# Patient Record
Sex: Female | Born: 1935 | Race: Black or African American | Hispanic: No | Marital: Single | Smoking: Never smoker
Health system: Southern US, Community
[De-identification: ages and names within clinical notes are randomized; demographics above are authoritative.]

## PROBLEM LIST (undated history)

## (undated) DIAGNOSIS — Z8679 Personal history of other diseases of the circulatory system: Secondary | ICD-10-CM

## (undated) DIAGNOSIS — I639 Cerebral infarction, unspecified: Secondary | ICD-10-CM

## (undated) DIAGNOSIS — I10 Essential (primary) hypertension: Secondary | ICD-10-CM

## (undated) DIAGNOSIS — I350 Nonrheumatic aortic (valve) stenosis: Secondary | ICD-10-CM

## (undated) DIAGNOSIS — I2510 Atherosclerotic heart disease of native coronary artery without angina pectoris: Secondary | ICD-10-CM

## (undated) DIAGNOSIS — Z9289 Personal history of other medical treatment: Secondary | ICD-10-CM

## (undated) DIAGNOSIS — E785 Hyperlipidemia, unspecified: Secondary | ICD-10-CM

## (undated) DIAGNOSIS — I779 Disorder of arteries and arterioles, unspecified: Secondary | ICD-10-CM

## (undated) DIAGNOSIS — I739 Peripheral vascular disease, unspecified: Secondary | ICD-10-CM

## (undated) DIAGNOSIS — E119 Type 2 diabetes mellitus without complications: Secondary | ICD-10-CM

## (undated) HISTORY — DX: Type 2 diabetes mellitus without complications: E11.9

## (undated) HISTORY — PX: WRIST FRACTURE SURGERY: SHX121

## (undated) HISTORY — PX: LEG SURGERY: SHX1003

## (undated) HISTORY — DX: Essential (primary) hypertension: I10

## (undated) HISTORY — DX: Personal history of other medical treatment: Z92.89

---

## 2012-09-07 ENCOUNTER — Encounter (HOSPITAL_BASED_OUTPATIENT_CLINIC_OR_DEPARTMENT_OTHER): Payer: Self-pay

## 2012-09-07 ENCOUNTER — Emergency Department (HOSPITAL_BASED_OUTPATIENT_CLINIC_OR_DEPARTMENT_OTHER)
Admission: EM | Admit: 2012-09-07 | Discharge: 2012-09-08 | Disposition: A | Discharge status: Home / Self Care | Payer: Medicare Other | Attending: Emergency Medicine | Admitting: Emergency Medicine

## 2012-09-07 DIAGNOSIS — I10 Essential (primary) hypertension: Secondary | ICD-10-CM | POA: Insufficient documentation

## 2012-09-07 DIAGNOSIS — Z23 Encounter for immunization: Secondary | ICD-10-CM | POA: Insufficient documentation

## 2012-09-07 DIAGNOSIS — Y92009 Unspecified place in unspecified non-institutional (private) residence as the place of occurrence of the external cause: Secondary | ICD-10-CM | POA: Insufficient documentation

## 2012-09-07 DIAGNOSIS — Y9389 Activity, other specified: Secondary | ICD-10-CM | POA: Insufficient documentation

## 2012-09-07 DIAGNOSIS — W230XXA Caught, crushed, jammed, or pinched between moving objects, initial encounter: Secondary | ICD-10-CM | POA: Insufficient documentation

## 2012-09-07 DIAGNOSIS — S61411A Laceration without foreign body of right hand, initial encounter: Secondary | ICD-10-CM

## 2012-09-07 DIAGNOSIS — E119 Type 2 diabetes mellitus without complications: Secondary | ICD-10-CM | POA: Insufficient documentation

## 2012-09-07 DIAGNOSIS — S61409A Unspecified open wound of unspecified hand, initial encounter: Secondary | ICD-10-CM | POA: Insufficient documentation

## 2012-09-07 NOTE — ED Provider Notes (Signed)
History     CSN: [id number]  Arrival date & time 09/07/12  2104   First MD Initiated Contact with Patient 09/07/12 2333      Chief Complaint  Patient presents with  . Hand Injury    (Consider location/radiation/quality/duration/timing/severity/associated sxs/prior treatment) HPI This is an 78 year old female who got her right hand caught in a door at home. She is not sure exactly what happened but she noticed afterwards that there was bleeding from her right thenar web. She noticed a laceration there. There was no associated pain. There was significant bleeding that was controlled by pressure. She is not sure of her last tetanus shot. She denies other injury. There is no sensory or motor deficit.  Past Medical History  Diagnosis Date  . Hypertension   . Diabetes mellitus without complication     Past Surgical History  Procedure Date  . Wrist fracture surgery   . Leg surgery     No family history on file.  History  Substance Use Topics  . Smoking status: Never Smoker   . Smokeless tobacco: Not on file  . Alcohol Use: No    OB History    Grav Para Term Preterm Abortions TAB SAB Ect Mult Living                  Review of Systems  All other systems reviewed and are negative.    Allergies  Aspirin and Codeine  Home Medications   Current Outpatient Rx  Name  Route  Sig  Dispense  Refill  . UNKNOWN TO PATIENT      DM pills-no insulin HTN meds           BP 150/52  Pulse 72  Temp 97.8 F (36.6 C) (Oral)  Resp 18  Ht 5' 3.5" (1.613 m)  Wt 140 lb (63.504 kg)  BMI 24.41 kg/m2  SpO2 98%  Physical Exam General: Well-developed, well-nourished female in no acute distress; appears younger than age of record HENT: normocephalic, atraumatic Eyes: Normal appearance Neck: supple Heart: regular rate and rhythm Lungs: Normal respiratory effort and excursion Abdomen: soft; nondistended Extremities: Arthritic changes; no acute deformity; flap laceration of  right thenar web, right hand distally neurovascularly intact with intact tendon function Neurologic: Awake, alert; motor function intact in all extremities and symmetric; no facial droop Skin: Warm and dry Psychiatric: Normal mood and affect    ED Course  Procedures (including critical care time)  LACERATION REPAIR Performed by: Mercede Rollo L Authorized by: Hanley Seamen Consent: Verbal consent obtained. Risks and benefits: risks, benefits and alternatives were discussed Consent given by: patient Patient identity confirmed: provided demographic data Prepped and Draped in normal sterile fashion Wound explored  Laceration Location: Right thenar web, complex  Laceration Length: 3 cm  No Foreign Bodies seen or palpated  Anesthesia: local infiltration  Local anesthetic: lidocaine 2 % with epinephrine  Anesthetic total: 2 ml  Irrigation method: syringe Amount of cleaning: standard  Skin closure: 5-0 Prolene   Number of sutures: 6   Technique: Simple interrupted   Patient tolerance: Patient tolerated the procedure well with no immediate complications.    MDM          Hanley Seamen, MD 09/08/12 [phone]

## 2012-09-07 NOTE — ED Notes (Signed)
MD at bedside. 

## 2012-09-07 NOTE — ED Notes (Signed)
Pt states she was attempting to close door-cut right hand-pt unsure of how happened/noticed blood-jagged lac noted to right web-cleaned in triage and gauze dsg applied

## 2012-09-08 MED ORDER — CEPHALEXIN 250 MG PO CAPS: 250.0000 mg | ORAL_CAPSULE | Freq: Four times a day (QID) | ORAL | Status: DC

## 2012-09-08 MED ORDER — CEPHALEXIN 250 MG PO CAPS
500.0000 mg | ORAL_CAPSULE | Freq: Once | ORAL | Status: AC
  Administered 2012-09-08: 500 mg via ORAL
  Filled 2012-09-08: qty 2

## 2012-09-08 MED ORDER — TETANUS-DIPHTH-ACELL PERTUSSIS 5-2.5-18.5 LF-MCG/0.5 IM SUSP
0.5000 mL | Freq: Once | INTRAMUSCULAR | Status: AC
  Administered 2012-09-08: 0.5 mL via INTRAMUSCULAR
  Filled 2012-09-08: qty 0.5

## 2012-09-22 ENCOUNTER — Emergency Department (HOSPITAL_BASED_OUTPATIENT_CLINIC_OR_DEPARTMENT_OTHER)
Admission: EM | Admit: 2012-09-22 | Discharge: 2012-09-22 | Disposition: A | Discharge status: Home / Self Care | Payer: Medicare Other | Attending: Emergency Medicine | Admitting: Emergency Medicine

## 2012-09-22 ENCOUNTER — Encounter (HOSPITAL_BASED_OUTPATIENT_CLINIC_OR_DEPARTMENT_OTHER): Payer: Self-pay | Admitting: *Deleted

## 2012-09-22 DIAGNOSIS — E119 Type 2 diabetes mellitus without complications: Secondary | ICD-10-CM | POA: Insufficient documentation

## 2012-09-22 DIAGNOSIS — I10 Essential (primary) hypertension: Secondary | ICD-10-CM | POA: Insufficient documentation

## 2012-09-22 DIAGNOSIS — Z4802 Encounter for removal of sutures: Secondary | ICD-10-CM | POA: Insufficient documentation

## 2012-09-22 DIAGNOSIS — Z79899 Other long term (current) drug therapy: Secondary | ICD-10-CM | POA: Insufficient documentation

## 2012-09-22 NOTE — ED Notes (Signed)
Right hand removal of stitches.

## 2012-09-22 NOTE — ED Provider Notes (Signed)
History     CSN: [id number]  Arrival date & time 09/22/12  1123   First MD Initiated Contact with Patient 09/22/12 1241      Chief Complaint  Patient presents with  . Suture / Staple Removal    (Consider location/radiation/quality/duration/timing/severity/associated sxs/prior treatment) Patient is a 78 y.o. female presenting with suture removal. The history is provided by the patient. No language interpreter was used.  Suture / Staple Removal  Treatments since wound repair include antibiotic ointment use. There has been no drainage from the wound. She has no difficulty moving the affected extremity or digit.   suture removal to the right thenar web x6. No erythema, drainage or pain.  Past Medical History  Diagnosis Date  . Hypertension   . Diabetes mellitus without complication     Past Surgical History  Procedure Date  . Wrist fracture surgery   . Leg surgery     No family history on file.  History  Substance Use Topics  . Smoking status: Never Smoker   . Smokeless tobacco: Not on file  . Alcohol Use: No    OB History    Grav Para Term Preterm Abortions TAB SAB Ect Mult Living                  Review of Systems  Constitutional: Negative.   HENT: Negative.   Eyes: Negative.   Respiratory: Negative.   Cardiovascular: Negative.   Gastrointestinal: Negative.   Skin:       Sutures to R hand  Neurological: Negative.   Psychiatric/Behavioral: Negative.   All other systems reviewed and are negative.    Allergies  Aspirin and Codeine  Home Medications   Current Outpatient Rx  Name  Route  Sig  Dispense  Refill  . CEPHALEXIN 250 MG PO CAPS   Oral   Take 1 capsule (250 mg total) by mouth 4 (four) times daily.   20 capsule   0   . UNKNOWN TO PATIENT      DM pills-no insulin HTN meds           BP 160/61  Pulse 66  Temp 98 F (36.7 C) (Oral)  Resp 18  SpO2 98%  Physical Exam  Nursing note and vitals reviewed. Constitutional: She is  oriented to person, place, and time. She appears well-developed and well-nourished.  HENT:  Head: Normocephalic and atraumatic.  Eyes: Conjunctivae normal and EOM are normal. Pupils are equal, round, and reactive to light.  Neck: Normal range of motion. Neck supple.  Cardiovascular: Normal rate, regular rhythm, normal heart sounds and intact distal pulses.  Exam reveals no gallop and no friction rub.   No murmur heard. Pulmonary/Chest: Effort normal and breath sounds normal.  Abdominal: Soft. Bowel sounds are normal.  Musculoskeletal: Normal range of motion. She exhibits no edema and no tenderness.  Neurological: She is alert and oriented to person, place, and time. She has normal reflexes.  Skin: Skin is warm and dry.       Sutures in tact to R hand  Psychiatric: She has a normal mood and affect.    ED Course  Procedures (including critical care time)  Labs Reviewed - No data to display No results found.   No diagnosis found.    MDM  Sutures removed to R hand         Remi Haggard, NP 09/23/12 1044

## 2012-09-30 NOTE — ED Provider Notes (Signed)
Medical screening examination/treatment/procedure(s) were performed by non-physician practitioner and as supervising physician I was immediately available for consultation/collaboration.   Charles B. Sheldon, MD 09/30/12 1024 

## 2013-11-11 ENCOUNTER — Non-Acute Institutional Stay (SKILLED_NURSING_FACILITY): Payer: Medicare Other | Admitting: Internal Medicine

## 2013-11-11 ENCOUNTER — Encounter: Payer: Self-pay | Admitting: Internal Medicine

## 2013-11-11 ENCOUNTER — Other Ambulatory Visit: Payer: Self-pay | Admitting: *Deleted

## 2013-11-11 DIAGNOSIS — S32599A Other specified fracture of unspecified pubis, initial encounter for closed fracture: Secondary | ICD-10-CM

## 2013-11-11 DIAGNOSIS — E119 Type 2 diabetes mellitus without complications: Secondary | ICD-10-CM

## 2013-11-11 DIAGNOSIS — I4891 Unspecified atrial fibrillation: Secondary | ICD-10-CM

## 2013-11-11 DIAGNOSIS — R42 Dizziness and giddiness: Secondary | ICD-10-CM | POA: Insufficient documentation

## 2013-11-11 DIAGNOSIS — S32509A Unspecified fracture of unspecified pubis, initial encounter for closed fracture: Secondary | ICD-10-CM

## 2013-11-11 MED ORDER — TRAMADOL HCL 50 MG PO TABS: ORAL_TABLET | ORAL | Status: DC

## 2013-11-11 NOTE — Telephone Encounter (Signed)
[facility] 

## 2013-11-11 NOTE — Progress Notes (Signed)
HISTORY & PHYSICAL  DATE: 11/11/2013   FACILITY: [facility]  LEVEL OF CARE: SNF (31)  ALLERGIES:  Allergies  Allergen Reactions  . Aspirin Nausea And Vomiting  . Codeine Nausea And Vomiting    CHIEF COMPLAINT:  Manage left inferior and superior pubic rami fractures, atrial fibrillation and diabetes mellitus  HISTORY OF PRESENT ILLNESS: Patient is an 80 year old African American female who was hospitalized after a fall.  PELVIC FRACTURE: The patient had a fall and sustained left inferior and superior pubic rami fractures. Conservative management was recommended by orthopedic surgery. Patient is admitted to this facility for short-term rehabilitation. The patient denies ongoing pain. No complications reported from the pain medication(s) currently being used.  ATRIAL FIBRILLATION: the patients atrial fibrillation remains stable.  The patient denies DOE, tachycardia, orthopnea, transient neurological sx, pedal edema, palpitations, & PNDs.  No complications noted from the medications currently being used.  DM:pt's DM remains stable.  Pt denies polyuria, polydipsia, polyphagia, changes in vision or hypoglycemic episodes.  No complications noted from the medication presently being used.  Last hemoglobin A1c is: Not available.  PAST MEDICAL HISTORY :  Past Medical History  Diagnosis Date  . Hypertension   . Diabetes mellitus without complication     PAST SURGICAL HISTORY: Past Surgical History  Procedure Laterality Date  . Wrist fracture surgery    . Leg surgery      SOCIAL HISTORY:  reports that she has never smoked. She does not have any smokeless tobacco history on file. She reports that she does not drink alcohol.  FAMILY HISTORY: None   CURRENT MEDICATIONS: Reviewed per MAR  REVIEW OF SYSTEMS:  See HPI otherwise 14 point ROS is negative.  PHYSICAL EXAMINATION  VS:  See VS section  GENERAL: no acute distress, normal body  habitus EYES: conjunctivae normal, sclerae normal, normal eye lids MOUTH/THROAT: lips without lesions,no lesions in the mouth,tongue is without lesions,uvula elevates in midline NECK: supple, trachea midline, no neck masses, no thyroid tenderness, no thyromegaly LYMPHATICS: no LAN in the neck, no supraclavicular LAN RESPIRATORY: breathing is even & unlabored, BS CTAB CARDIAC: Heart rate is irregularly irregular, no murmur,no extra heart sounds, no edema GI:  ABDOMEN: abdomen soft, normal BS, no masses, no tenderness  LIVER/SPLEEN: no hepatomegaly, no splenomegaly MUSCULOSKELETAL: HEAD: normal to inspection & palpation BACK: no kyphosis, scoliosis or spinal processes tenderness EXTREMITIES: LEFT UPPER EXTREMITY: full range of motion, normal strength & tone RIGHT UPPER EXTREMITY:  full range of motion, normal strength & tone LEFT LOWER EXTREMITY:  range of motion minimal due to pain, normal strength & tone RIGHT LOWER EXTREMITY: Moderate range of motion, normal strength & tone PSYCHIATRIC: the patient is alert & oriented to person, affect & behavior appropriate  LABS/RADIOLOGY:  At discharge: Sodium 128, potassium 4.4, chloride 93, glucose 106, bicarbonate 26, BUN 11, creatinine 0.74,  TSH normal, magnesium 1.6 2D echo-no significant wall motion abnormalities, EF greater than 60% Left hip x-ray-subacute fractures of the left superior and inferior pubic rami without any significant displacement   ASSESSMENT/PLAN:  Left inferior and superior pubic rami fracture-conservative management only Diabetes mellitus-continue current medications atrial fibrillation-rate controlled Dizziness-medications checked. Will request blood pressure monitoring and orthostatic parameters. Hypertension-blood pressure is elevated. Will monitor for now. Hyperlipidemia-continue lovstatin Check CBC and BMP  I have reviewed patient' medical records received at admission/from hospitalization.  CPT CODE:  [id number]  Angela CoxGayani Y Agueda Houpt, MD [facility] [phone]

## 2013-11-14 ENCOUNTER — Encounter: Payer: Self-pay | Admitting: *Deleted

## 2013-12-02 ENCOUNTER — Non-Acute Institutional Stay (SKILLED_NURSING_FACILITY): Payer: Medicare Other | Admitting: Internal Medicine

## 2013-12-02 DIAGNOSIS — I4891 Unspecified atrial fibrillation: Secondary | ICD-10-CM

## 2013-12-02 DIAGNOSIS — S32599A Other specified fracture of unspecified pubis, initial encounter for closed fracture: Secondary | ICD-10-CM

## 2013-12-02 DIAGNOSIS — E878 Other disorders of electrolyte and fluid balance, not elsewhere classified: Secondary | ICD-10-CM

## 2013-12-02 DIAGNOSIS — E119 Type 2 diabetes mellitus without complications: Secondary | ICD-10-CM

## 2013-12-02 DIAGNOSIS — S32509A Unspecified fracture of unspecified pubis, initial encounter for closed fracture: Secondary | ICD-10-CM

## 2013-12-02 DIAGNOSIS — I10 Essential (primary) hypertension: Secondary | ICD-10-CM

## 2013-12-02 NOTE — Progress Notes (Signed)
Patient ID: Janet Bond, female   DOB: 10/10/1922, 80 y.o.   MRN: [id number]   This is a discharge note.  Level of care skilled.  Facility [facility].  Chief complaint-discharge note.  History of present illness.  Patient is a pleasant 80 year old female who is here for rehabilitation after hospitalization for dizziness after falling.  She was found to be in atrial fibrillation her electrolytes also were quite abnormal with a sodium of 113 and potassium of 2.5.  She was treated with IV normal saline- potassium was supplemented--and her diuretic was discontinued.  The atrial fibrillation was thought possibly secondary to the electrolyte abnormalities-she was placed on aspirin 81 mg a day which she continues on.  She also is on Coreg this was decreased in the hospital secondary to bradycardia currently on 3.125 mg twice a day.  Prior to admission to the hospital she did have a fall --x-ray showed subacute fractures of the left superior and inferior pubic rami without significant displacement.  Conservative management was advised.  In the hospital patient also was found to have elevated troponin cardiology was consult and in advised medical management she is on Coreg as well as aspirin as well as a statin-echocardiogram without significant wall motion abnormalities ejection fraction was over 60% she was asymptomatic of chest pain.  Patient also is a type II diabetic has been on Glucophage--blood sugars are stable largely the 90s to low 100s I see. a couple 80s in the morning these are relatively rare.  Patient also has a history of hypertension she is onLosartan  listed 100 mg a day and Coreg 3.125 mg twice a day--as well as Norvasc 10 mg a day  her blood pressures appear to be variable 143/73  listed today-occasionally in the 150s but also I see a reading of 108 systolically in the 130s systolically- Week ago her Norvasc was increased secondary to elevated readings  Her baseline systolic  is quite variable largely ranging from the 130s-to occasionally over 150 although not consistently  Medical social history has been reviewed per admission note on 11/11/2013.  Medications have been reviewed per MAR tramadol 50 mg 3 times a day when necessary.  Losartan 100 mg daily.  Aspirin enteric-coated 81 mg a day.  Norvasc 10 mg daily.  Lovastatin 40 mg daily.  Coreg 3.125 mg twice a day.  Methocarbamol 500 mg twice a day.  Glucophage 1000 mg twice a day.  Review of systems  In general no complaints of fever or chills says she feels well.  Skin does not complaining of itching or rashes.  Head ears eyes nose mouth and throat-does not complaining of visual changes or difficulty swallowing or sore throat.  Respiratory-no complaints of shortness of breath or cough.  Cardiac no chest pain does have a history of hypertension.  GI-no abdominal pain nausea vomiting diarrhea or constipation.  GU-she feels like she has some burning with urination this is intermittent it comes and goes she states. \ Muscle skeletal-at this point pelvic pain joint pain appears controlled with tramadol muscle relaxer  Neurologic-does not complaining of headache or numbness-says she occasionally has dizziness this has been a chronic situation according to her  Psych-does not complaining of depression or anxiety  Physical exam.  Temperature 97.2 pulse 80 respirations 20 blood pressure taken manually 150/70.  In general this is a somewhat frail elderly female in no distress sitting comfortably in her wheelchair.  Her skin is warm and dry.  Eyes she has prescription lenses visual  acuity appears grossly intact sclera and conjunctiva are clear pupils appear reactive to light.  Oropharynx is clear mucous membranes moist.  Chest is clear to auscultation no labored breathing.  Heart is regular rate and rhythm with an occasional irregular beat without murmur gallop or rub she has trace lower  extremity edema.  Abdomen is soft nontender positive bowel sounds.  GU-could not really appreciate any suprapubic tenderness.  Muscle skeletal is able to stand and ambulate with a walker-I do not note any deformities upper extremities strength appears to be intact all 4 extremities.  Neurologic grossly intact no lateralizing findings cranial nerves intact speech is clear.  Psych -- is alert and oriented pleasant and appropriate -  Labs.  11/15/2013.  WBC 4.5 hemoglobin 10.5 platelets 262.  Sodium 134 potassium 4.3 BUN 10 creatinine 0.6.    Assessment and plan.  #1-history of electrolyte abnormalities-this has been corrected-clinically she appears stable Will update laboratories including a CBC and CMP before discharge-.  #2-history of pelvic fracture-he appears to be doing quite well with this-has tramadol and muscle relaxer- is able to ambulate appears without much pain with her walker.--She will need continued PT and OT as well as nursing support--home health at home  #3-atrial fibrillation this appears to be stable she is on Coreg as well as aspirin again this was thought to be possibly electrolyte related.  #4-hypertension-she has variable systolics they appear possibly somewhat improved with the increase in Norvasc recently-at this point will defer to primary care provider since hesitant to be too aggressive with medication changes right before discharge --to continue Losartan as well as Coreg in addition to the Norvasc   #5-history of diabetes type 2-this appears to be stable on Glucophage with blood sugars in the 90s-low 100s  Patient will be going home to her house apparently there are no steps-she will need close followup by primary care provider-again will get labs tomorrow to make sure her electrolytes are okay-she appears to have done well with therapy but will need continued support there as well   [id number]-of note greater than 30 minutes spent on this discharge  summary-including discussion with nursing staff as well as coordinating plan of care

## 2013-12-05 DIAGNOSIS — Z5189 Encounter for other specified aftercare: Secondary | ICD-10-CM

## 2013-12-05 DIAGNOSIS — I4891 Unspecified atrial fibrillation: Secondary | ICD-10-CM

## 2013-12-05 DIAGNOSIS — E119 Type 2 diabetes mellitus without complications: Secondary | ICD-10-CM

## 2013-12-05 DIAGNOSIS — M6281 Muscle weakness (generalized): Secondary | ICD-10-CM

## 2013-12-05 DIAGNOSIS — R269 Unspecified abnormalities of gait and mobility: Secondary | ICD-10-CM

## 2013-12-05 DIAGNOSIS — I10 Essential (primary) hypertension: Secondary | ICD-10-CM

## 2013-12-08 ENCOUNTER — Encounter: Payer: Self-pay | Admitting: Internal Medicine

## 2014-01-14 ENCOUNTER — Other Ambulatory Visit: Payer: Self-pay | Admitting: Internal Medicine

## 2015-01-12 ENCOUNTER — Encounter (HOSPITAL_BASED_OUTPATIENT_CLINIC_OR_DEPARTMENT_OTHER): Payer: Self-pay | Admitting: Emergency Medicine

## 2015-01-12 ENCOUNTER — Encounter (HOSPITAL_COMMUNITY)
Admission: EM | Disposition: A | Discharge status: Skilled Nursing Facility | Payer: Self-pay | Source: Home / Self Care | Attending: Interventional Cardiology

## 2015-01-12 ENCOUNTER — Inpatient Hospital Stay (HOSPITAL_BASED_OUTPATIENT_CLINIC_OR_DEPARTMENT_OTHER)
Admission: EM | Admit: 2015-01-12 | Discharge: 2015-01-19 | DRG: 250 | Disposition: A | Discharge status: Skilled Nursing Facility | Payer: Medicare Other | Attending: Interventional Cardiology | Admitting: Interventional Cardiology

## 2015-01-12 ENCOUNTER — Ambulatory Visit (HOSPITAL_COMMUNITY): Admit: 2015-01-12 | Payer: Self-pay | Admitting: Interventional Cardiology

## 2015-01-12 ENCOUNTER — Other Ambulatory Visit (HOSPITAL_BASED_OUTPATIENT_CLINIC_OR_DEPARTMENT_OTHER): Payer: Self-pay

## 2015-01-12 DIAGNOSIS — Z885 Allergy status to narcotic agent status: Secondary | ICD-10-CM | POA: Diagnosis not present

## 2015-01-12 DIAGNOSIS — I480 Paroxysmal atrial fibrillation: Secondary | ICD-10-CM | POA: Diagnosis present

## 2015-01-12 DIAGNOSIS — I350 Nonrheumatic aortic (valve) stenosis: Secondary | ICD-10-CM

## 2015-01-12 DIAGNOSIS — I213 ST elevation (STEMI) myocardial infarction of unspecified site: Secondary | ICD-10-CM | POA: Diagnosis not present

## 2015-01-12 DIAGNOSIS — Z7902 Long term (current) use of antithrombotics/antiplatelets: Secondary | ICD-10-CM

## 2015-01-12 DIAGNOSIS — I63111 Cerebral infarction due to embolism of right vertebral artery: Secondary | ICD-10-CM | POA: Diagnosis not present

## 2015-01-12 DIAGNOSIS — I2111 ST elevation (STEMI) myocardial infarction involving right coronary artery: Secondary | ICD-10-CM | POA: Diagnosis not present

## 2015-01-12 DIAGNOSIS — E785 Hyperlipidemia, unspecified: Secondary | ICD-10-CM | POA: Diagnosis not present

## 2015-01-12 DIAGNOSIS — R011 Cardiac murmur, unspecified: Secondary | ICD-10-CM | POA: Diagnosis not present

## 2015-01-12 DIAGNOSIS — N179 Acute kidney failure, unspecified: Secondary | ICD-10-CM | POA: Diagnosis not present

## 2015-01-12 DIAGNOSIS — E871 Hypo-osmolality and hyponatremia: Secondary | ICD-10-CM | POA: Diagnosis not present

## 2015-01-12 DIAGNOSIS — R471 Dysarthria and anarthria: Secondary | ICD-10-CM | POA: Diagnosis not present

## 2015-01-12 DIAGNOSIS — I2119 ST elevation (STEMI) myocardial infarction involving other coronary artery of inferior wall: Principal | ICD-10-CM | POA: Diagnosis present

## 2015-01-12 DIAGNOSIS — I2510 Atherosclerotic heart disease of native coronary artery without angina pectoris: Secondary | ICD-10-CM | POA: Diagnosis present

## 2015-01-12 DIAGNOSIS — I4891 Unspecified atrial fibrillation: Secondary | ICD-10-CM | POA: Diagnosis not present

## 2015-01-12 DIAGNOSIS — R260 Ataxic gait: Secondary | ICD-10-CM | POA: Diagnosis not present

## 2015-01-12 DIAGNOSIS — R4781 Slurred speech: Secondary | ICD-10-CM | POA: Diagnosis not present

## 2015-01-12 DIAGNOSIS — I25119 Atherosclerotic heart disease of native coronary artery with unspecified angina pectoris: Secondary | ICD-10-CM | POA: Diagnosis not present

## 2015-01-12 DIAGNOSIS — E1159 Type 2 diabetes mellitus with other circulatory complications: Secondary | ICD-10-CM | POA: Diagnosis present

## 2015-01-12 DIAGNOSIS — I639 Cerebral infarction, unspecified: Secondary | ICD-10-CM | POA: Diagnosis not present

## 2015-01-12 DIAGNOSIS — I10 Essential (primary) hypertension: Secondary | ICD-10-CM | POA: Diagnosis not present

## 2015-01-12 DIAGNOSIS — E876 Hypokalemia: Secondary | ICD-10-CM | POA: Diagnosis present

## 2015-01-12 DIAGNOSIS — I6340 Cerebral infarction due to embolism of unspecified cerebral artery: Secondary | ICD-10-CM | POA: Diagnosis not present

## 2015-01-12 DIAGNOSIS — Z7982 Long term (current) use of aspirin: Secondary | ICD-10-CM

## 2015-01-12 DIAGNOSIS — Z887 Allergy status to serum and vaccine status: Secondary | ICD-10-CM | POA: Diagnosis not present

## 2015-01-12 DIAGNOSIS — Z79899 Other long term (current) drug therapy: Secondary | ICD-10-CM

## 2015-01-12 DIAGNOSIS — I779 Disorder of arteries and arterioles, unspecified: Secondary | ICD-10-CM | POA: Diagnosis present

## 2015-01-12 DIAGNOSIS — I739 Peripheral vascular disease, unspecified: Secondary | ICD-10-CM

## 2015-01-12 DIAGNOSIS — R079 Chest pain, unspecified: Secondary | ICD-10-CM | POA: Diagnosis present

## 2015-01-12 DIAGNOSIS — E119 Type 2 diabetes mellitus without complications: Secondary | ICD-10-CM

## 2015-01-12 DIAGNOSIS — I2582 Chronic total occlusion of coronary artery: Secondary | ICD-10-CM | POA: Diagnosis present

## 2015-01-12 DIAGNOSIS — Z886 Allergy status to analgesic agent status: Secondary | ICD-10-CM

## 2015-01-12 DIAGNOSIS — Z8679 Personal history of other diseases of the circulatory system: Secondary | ICD-10-CM

## 2015-01-12 DIAGNOSIS — R531 Weakness: Secondary | ICD-10-CM

## 2015-01-12 HISTORY — DX: Atherosclerotic heart disease of native coronary artery without angina pectoris: I25.10

## 2015-01-12 HISTORY — DX: Disorder of arteries and arterioles, unspecified: I77.9

## 2015-01-12 HISTORY — DX: Nonrheumatic aortic (valve) stenosis: I35.0

## 2015-01-12 HISTORY — DX: Personal history of other diseases of the circulatory system: Z86.79

## 2015-01-12 HISTORY — DX: Peripheral vascular disease, unspecified: I73.9

## 2015-01-12 HISTORY — DX: Hyperlipidemia, unspecified: E78.5

## 2015-01-12 HISTORY — PX: LEFT HEART CATHETERIZATION WITH CORONARY ANGIOGRAM: SHX5451

## 2015-01-12 HISTORY — DX: Cerebral infarction, unspecified: I63.9

## 2015-01-12 LAB — DIFFERENTIAL
BASOS ABS: 0 10*3/uL (ref 0.0–0.1)
Basophils Relative: 0 % (ref 0–1)
Eosinophils Absolute: 0.2 10*3/uL (ref 0.0–0.7)
Eosinophils Relative: 3 % (ref 0–5)
LYMPHS ABS: 1.4 10*3/uL (ref 0.7–4.0)
Lymphocytes Relative: 28 % (ref 12–46)
Monocytes Absolute: 0.7 10*3/uL (ref 0.1–1.0)
Monocytes Relative: 13 % — ABNORMAL HIGH (ref 3–12)
NEUTROS ABS: 2.9 10*3/uL (ref 1.7–7.7)
NEUTROS PCT: 56 % (ref 43–77)

## 2015-01-12 LAB — CBC
HCT: 34.7 % — ABNORMAL LOW (ref 36.0–46.0)
HEMOGLOBIN: 11 g/dL — AB (ref 12.0–15.0)
MCH: 26 pg (ref 26.0–34.0)
MCHC: 31.7 g/dL (ref 30.0–36.0)
MCV: 82 fL (ref 78.0–100.0)
Platelets: 191 10*3/uL (ref 150–400)
RBC: 4.23 MIL/uL (ref 3.87–5.11)
RDW: 15.7 % — ABNORMAL HIGH (ref 11.5–15.5)
WBC: 5.2 10*3/uL (ref 4.0–10.5)

## 2015-01-12 LAB — PROTIME-INR
INR: 0.96 (ref 0.00–1.49)
Prothrombin Time: 12.8 seconds (ref 11.6–15.2)

## 2015-01-12 LAB — I-STAT TROPONIN, ED: Troponin i, poc: 0.41 ng/mL (ref 0.00–0.08)

## 2015-01-12 LAB — BASIC METABOLIC PANEL
Anion gap: 7 (ref 5–15)
BUN: 15 mg/dL (ref 6–23)
CHLORIDE: 98 mmol/L (ref 96–112)
CO2: 28 mmol/L (ref 19–32)
CREATININE: 0.73 mg/dL (ref 0.50–1.10)
Calcium: 8.9 mg/dL (ref 8.4–10.5)
GFR calc Af Amer: 85 mL/min — ABNORMAL LOW (ref >90)
GFR calc non Af Amer: 74 mL/min — ABNORMAL LOW (ref >90)
Glucose, Bld: 158 mg/dL — ABNORMAL HIGH (ref 70–99)
POTASSIUM: 3.2 mmol/L — AB (ref 3.5–5.1)
Sodium: 133 mmol/L — ABNORMAL LOW (ref 135–145)

## 2015-01-12 LAB — APTT: aPTT: 29 seconds (ref 24–37)

## 2015-01-12 SURGERY — LEFT HEART CATHETERIZATION WITH CORONARY ANGIOGRAM
Anesthesia: LOCAL

## 2015-01-12 MED ORDER — HEPARIN (PORCINE) IN NACL 100-0.45 UNIT/ML-% IJ SOLN
INTRAMUSCULAR | Status: AC
  Filled 2015-01-12: qty 250

## 2015-01-12 MED ORDER — HEPARIN (PORCINE) IN NACL 100-0.45 UNIT/ML-% IJ SOLN
700.0000 [IU]/h | INTRAMUSCULAR | Status: DC
Start: 2015-01-12 — End: 2015-01-12

## 2015-01-12 MED ORDER — POTASSIUM CHLORIDE CRYS ER 20 MEQ PO TBCR
40.0000 meq | EXTENDED_RELEASE_TABLET | Freq: Once | ORAL | Status: AC
  Administered 2015-01-13: 40 meq via ORAL
  Filled 2015-01-12: qty 2

## 2015-01-12 MED ORDER — NITROGLYCERIN IN D5W 200-5 MCG/ML-% IV SOLN
0.0000 ug/min | INTRAVENOUS | Status: DC
  Administered 2015-01-12 – 2015-01-13 (×2): 5 ug/min via INTRAVENOUS
  Filled 2015-01-12: qty 250

## 2015-01-12 MED ORDER — SODIUM CHLORIDE 0.9 % IV SOLN
1.7500 mg/kg/h | INTRAVENOUS | Status: AC
  Filled 2015-01-12: qty 250

## 2015-01-12 MED ORDER — ASPIRIN 325 MG PO TABS
325.0000 mg | ORAL_TABLET | ORAL | Status: AC
  Filled 2015-01-12: qty 1

## 2015-01-12 MED ORDER — NITROGLYCERIN IN D5W 200-5 MCG/ML-% IV SOLN
INTRAVENOUS | Status: AC
  Filled 2015-01-12: qty 250

## 2015-01-12 MED ORDER — HEPARIN SODIUM (PORCINE) 5000 UNIT/ML IJ SOLN
INTRAMUSCULAR | Status: AC
  Administered 2015-01-12: 3500 [IU] via INTRAVENOUS
  Filled 2015-01-12: qty 1

## 2015-01-12 MED ORDER — ASPIRIN 81 MG PO CHEW
CHEWABLE_TABLET | ORAL | Status: AC
  Administered 2015-01-12: 405 mg
  Filled 2015-01-12: qty 4

## 2015-01-12 MED ORDER — HEPARIN BOLUS VIA INFUSION: 3500.0000 [IU] | Freq: Once | INTRAVENOUS | Status: DC

## 2015-01-12 MED ORDER — HEPARIN SODIUM (PORCINE) 5000 UNIT/ML IJ SOLN
60.0000 [IU]/kg | Freq: Once | INTRAMUSCULAR | Status: DC
  Administered 2015-01-12: 3500 [IU] via INTRAVENOUS

## 2015-01-12 NOTE — ED Notes (Signed)
Patient with decreased BP - per MD Nitro drip D/c'd

## 2015-01-12 NOTE — H&P (Signed)
Janet Bond is an 81 y.o. female.   Primary Cardiologist: PMD: Chief Complaint: Chest discomfort HPI: 81 year old woman with history of hypertension and diabetes who began having some chest discomfort in the late afternoon. She also developed some nausea and vomiting. Eventually, her nephew brought her to the [facility] [place] ER. She was found to have inferior ST elevations. She continues to have chest discomfort. She has not had any significant bleeding problems.  During transport, she had some bradycardia. Blood pressure has been fairly stable. She has vomited just before arriving to the emergency room. She feels some nausea at this time as well.  Past Medical History  Diagnosis Date  . Hypertension   . Diabetes mellitus without complication   . Atrial fibrillation     Past Surgical History  Procedure Laterality Date  . Wrist fracture surgery    . Leg surgery      History reviewed. No pertinent family history. Social History:  reports that she has never smoked. She does not have any smokeless tobacco history on file. She reports that she does not drink alcohol. Her drug history is not on file.  Allergies:  Allergies  Allergen Reactions  . Aspirin Nausea And Vomiting  . Codeine Nausea And Vomiting  . Influenza Vaccines     Medications Prior to Admission  Medication Sig Dispense Refill  . amLODipine (NORVASC) 10 MG tablet Take 10 mg by mouth daily.    Marland Kitchen aspirin 81 MG tablet Take 81 mg by mouth daily.    . carvedilol (COREG) 3.125 MG tablet Take 3.125 mg by mouth 2 (two) times daily with a meal.    . losartan (COZAAR) 100 MG tablet Take 100 mg by mouth daily. For HTN    . lovastatin (MEVACOR) 40 MG tablet Take 40 mg by mouth at bedtime. To lower cholesterol    . metFORMIN (GLUCOPHAGE) 500 MG tablet Take 1,000 mg by mouth 2 (two) times daily with a meal. For diabetes    . methocarbamol (ROBAXIN) 500 MG tablet Take 500 mg by mouth 2 (two) times daily with a meal. For muscle  spasms    . traMADol (ULTRAM) 50 MG tablet Take one tablet by mouth three times daily as needed for pain 90 tablet 5    Results for orders placed or performed during the hospital encounter of 01/12/15 (from the past 48 hour(s))  CBC     Status: Abnormal   Collection Time: 01/12/15  9:39 PM  Result Value Ref Range   WBC 5.2 4.0 - 10.5 K/uL   RBC 4.23 3.87 - 5.11 MIL/uL   Hemoglobin 11.0 (L) 12.0 - 15.0 g/dL   HCT 34.7 (L) 36.0 - 46.0 %   MCV 82.0 78.0 - 100.0 fL   MCH 26.0 26.0 - 34.0 pg   MCHC 31.7 30.0 - 36.0 g/dL   RDW 15.7 (H) 11.5 - 15.5 %   Platelets 191 150 - 400 K/uL  Differential     Status: Abnormal   Collection Time: 01/12/15  9:39 PM  Result Value Ref Range   Neutrophils Relative % 56 43 - 77 %   Neutro Abs 2.9 1.7 - 7.7 K/uL   Lymphocytes Relative 28 12 - 46 %   Lymphs Abs 1.4 0.7 - 4.0 K/uL   Monocytes Relative 13 (H) 3 - 12 %   Monocytes Absolute 0.7 0.1 - 1.0 K/uL   Eosinophils Relative 3 0 - 5 %   Eosinophils Absolute 0.2 0.0 - 0.7 K/uL  Basophils Relative 0 0 - 1 %   Basophils Absolute 0.0 0.0 - 0.1 K/uL  Protime-INR     Status: None   Collection Time: 01/12/15  9:39 PM  Result Value Ref Range   Prothrombin Time 12.8 11.6 - 15.2 seconds   INR 0.96 0.00 - 1.49  APTT     Status: None   Collection Time: 01/12/15  9:39 PM  Result Value Ref Range   aPTT 29 24 - 37 seconds  Basic metabolic panel     Status: Abnormal   Collection Time: 01/12/15  9:39 PM  Result Value Ref Range   Sodium 133 (L) 135 - 145 mmol/L   Potassium 3.2 (L) 3.5 - 5.1 mmol/L   Chloride 98 96 - 112 mmol/L   CO2 28 19 - 32 mmol/L   Glucose, Bld 158 (H) 70 - 99 mg/dL   BUN 15 6 - 23 mg/dL   Creatinine, Ser 0.73 0.50 - 1.10 mg/dL   Calcium 8.9 8.4 - 10.5 mg/dL   GFR calc non Af Amer 74 (L) >90 mL/min   GFR calc Af Amer 85 (L) >90 mL/min    Comment: (NOTE) The eGFR has been calculated using the CKD EPI equation. This calculation has not been validated in all clinical  situations. eGFR's persistently <90 mL/min signify possible Chronic Kidney Disease.    Anion gap 7 5 - 15  I-stat troponin, ED     Status: Abnormal   Collection Time: 01/12/15  9:42 PM  Result Value Ref Range   Troponin i, poc 0.41 (HH) 0.00 - 0.08 ng/mL   Comment NOTIFIED PHYSICIAN    Comment 3            Comment: Due to the release kinetics of cTnI, a negative result within the first hours of the onset of symptoms does not rule out myocardial infarction with certainty. If myocardial infarction is still suspected, repeat the test at appropriate intervals.    No results found.  ROS: As above  OBJECTIVE:   Vitals:   Filed Vitals:   01/12/15 2150 01/12/15 2152 01/12/15 2155 01/12/15 2230  BP:  149/46    Pulse: 72   45  Temp:      TempSrc:      Resp: 13 14 22    Height:      Weight:      SpO2: 100%      I&O's:  No intake or output data in the 24 hours ending 01/12/15 2343 TELEMETRY: Reviewed telemetry pt in sinus bradycardia:     PHYSICAL EXAM General: Well developed, well nourished, in no acute distress Head:   Normal cephalic and atramatic  Lungs:   Clear bilaterally to auscultation. Heart:  Bradycardic Abdomen: abdomen soft and non-tender Msk:  Back normal,  Normal strength and tone for age. Extremities:   No edema.  3+ right femoral pulse, 2+ right radial pulse Neuro: Alert and oriented. Psych:  Normal affect, responds appropriately  LABS: Basic Metabolic Panel:  Recent Labs  01/12/15 2139  NA 133*  K 3.2*  CL 98  CO2 28  GLUCOSE 158*  BUN 15  CREATININE 0.73  CALCIUM 8.9   Liver Function Tests: No results for input(s): AST, ALT, ALKPHOS, BILITOT, PROT, ALBUMIN in the last 72 hours. No results for input(s): LIPASE, AMYLASE in the last 72 hours. CBC:  Recent Labs  01/12/15 2139  WBC 5.2  NEUTROABS 2.9  HGB 11.0*  HCT 34.7*  MCV 82.0  PLT 191  Cardiac Enzymes: No results for input(s): CKTOTAL, CKMB, CKMBINDEX, TROPONINI in the last 72  hours. BNP: Invalid input(s): POCBNP D-Dimer: No results for input(s): DDIMER in the last 72 hours. Hemoglobin A1C: No results for input(s): HGBA1C in the last 72 hours. Fasting Lipid Panel: No results for input(s): CHOL, HDL, LDLCALC, TRIG, CHOLHDL, LDLDIRECT in the last 72 hours. Thyroid Function Tests: No results for input(s): TSH, T4TOTAL, T3FREE, THYROIDAB in the last 72 hours.  Invalid input(s): FREET3 Anemia Panel: No results for input(s): VITAMINB12, FOLATE, FERRITIN, TIBC, IRON, RETICCTPCT in the last 72 hours. Coag Panel:   Lab Results  Component Value Date   INR 0.96 01/12/2015       Assessment/Plan ECG reveals acute inferior ST elevation MI  1) She is not allergic to aspirin. She is intolerant. We'll proceed with emergent cardiac catheterization and likely revascularization. Would likely lean more towards a bare-metal stent given her age and intolerance of aspirin. She will need secondary prevention as well including lipid-lowering therapy. Continue beta blocker and ACE inhibitor eventually. Much of her plan will be dependent on the results of the cardiac catheterization.  Nellene Courtois S. 01/12/2015, 11:43 PM

## 2015-01-12 NOTE — ED Notes (Signed)
EMS at the bed side to transfer the patient.   Patient now having episodes of SB and possible block at times. Patient is awake and alert and BP is normal.

## 2015-01-12 NOTE — ED Notes (Signed)
[facility] called for transport of STEMI. Unable to take the patient

## 2015-01-12 NOTE — ED Notes (Signed)
Patient states that she is having pain in her central chest all day today. Reports that she is also having N/V

## 2015-01-12 NOTE — CV Procedure (Signed)
PROCEDURE:  Left heart catheterization with selective coronary angiography, aspiration thrombectomy of the posterolateral artery (Right PAV), PCI of the posterolateral branch.  INDICATIONS:  Acute inferior ST elevation MI  The risks, benefits, and details of the procedure were explained to the patient.  The patient verbalized understanding and wanted to proceed. Emergency consent was obtained.  PROCEDURE TECHNIQUE:  After Xylocaine anesthesia a 27F sheath was placed in the right femoral artery with a single anterior needle wall stick.   Left coronary angiography was done using a Judkins L4 guide catheter.  Right coronary angiography was done using a Judkins R4 guide catheter.  Left ventriculography was done using a pigtail catheter.    CONTRAST:  Total of 65 cc.  COMPLICATIONS:  None.    HEMODYNAMICS:  Aortic pressure was 107/51; LV pressure was 118/1; LVEDP 6.  There was no gradient between the left ventricle and aorta.    ANGIOGRAPHIC DATA:   The left main coronary artery is short but patent.  The left anterior descending artery is a large vessel. In the proximal vessel, there is a heavily calcified area with a 50-60% stenosis, which is best seen in the caudal views. There is a large first diagonal which is widely patent. The second diagonal has a moderate proximal stenosis. The LAD is large and wraps around the apex. There are left to right collaterals.   The left circumflex artery is is a large vessel. In the proximal vessel, there is a moderate stenosis. There is a large first obtuse marginal. The continuation of the circumflex has moderate ostial stenosis.  The right coronary artery is  a large, dominant vessel. There are mild irregularities in the mid RCA. There is a large posterior descending artery which is patent. The proximal posterior lateral artery is occluded.  LEFT VENTRICULOGRAM:  Left ventricular angiogram was not done.  LVEDP was 6 mmHg.  PCI NARRATIVE: A JR4 guide  catheter was used to engage the RCA. A BMW wire was placed across the occlusion in the posterior lateral artery. A priority 1 thrombectomy catheter was used to perform aspiration thrombectomy. This was successful with a large burden of thrombus being removed. A 2.5 x 12 balloon was used to dilate the area disease. There was a 10% residual stenosis. TIMI-3 flow was restored after initial TIMI 0 flow. There was sluggish flow in a distal posterior lateral branch. The patient had some residual ST elevation and chest discomfort. The slow flow was too far distal in the branch to be intervened upon. Given her age, and her intolerance to aspirin as well as the adequate angioplasty result, we elected not to place a stent. Therefore, she will not be committed to dual antiplatelet therapy for any set period of time.  Intracoronary nitroglycerin was given. Overall, the patient tolerated the procedure well.  IMPRESSIONS:  1. Normal left main coronary artery. 2. Moderate disease in the  left anterior descending artery and its branches. 3.  Moderate disease in theleft circumflex artery and its branches. 4.  Occluded posterior lateral artery branch of the right coronary artery.  TIMI-3 flow was restored with aspiration thrombectomy. This was followed by balloon angioplasty. There was an excellent angiographic result and therefore a stent was not placed given the patient's age and the fact that we did not want to commit her to dual antiplatelet therapy. 5. Left ventricular systolic function was not assessed.  LVEDP 6 mmHg.   RECOMMENDATION:  Continue aggressive secondary prevention. Resume beta blocker. Will continue  Angiomax until her current bag runs out. Hopefully, this will help with some of the slow flow in the distal posterior lateral branch. Start aspirin and Plavix for now although, these can be stopped if needed.  She'll be watched in the ICU.

## 2015-01-12 NOTE — ED Provider Notes (Signed)
CSN: [id number]     Arrival date & time 01/12/15  2113 History  This chart was scribed for Janet Nayobert Talik Casique, MD by Gwenyth Oberatherine Macek, ED Scribe. This patient was seen in room MH12/MH12 and the patient's care was started at 9:23 PM.    Chief Complaint  Patient presents with  . Code STEMI  . Chest Pain    The history is provided by the patient and a relative. No language interpreter was used.    HPI Comments: Hosie Spangleaomi Dusseau is a 81 y.o. female with a history of DM and HTN who presents to the Emergency Department complaining of constant, moderate central CP that started 7 hours ago. She states SOB, nausea and vomiting that started after the onset of CP as associated symptoms. Pt does take Aspirin because of previous adverse reactions. She is not on anti-coagulants.   Past Medical History  Diagnosis Date  . Hypertension   . Diabetes mellitus without complication   . Atrial fibrillation    Past Surgical History  Procedure Laterality Date  . Wrist fracture surgery    . Leg surgery     History reviewed. No pertinent family history. History  Substance Use Topics  . Smoking status: Never Smoker   . Smokeless tobacco: Not on file  . Alcohol Use: No   OB History    No data available     Review of Systems  Respiratory: Positive for shortness of breath.   Cardiovascular: Positive for chest pain.  Gastrointestinal: Positive for nausea and vomiting.      Allergies  Aspirin; Codeine; and Influenza vaccines  Home Medications   Prior to Admission medications   Medication Sig Start Date End Date Taking? Authorizing Provider  amLODipine (NORVASC) 10 MG tablet Take 10 mg by mouth daily.    Historical Provider, MD  aspirin 81 MG tablet Take 81 mg by mouth daily.    Historical Provider, MD  carvedilol (COREG) 3.125 MG tablet Take 3.125 mg by mouth 2 (two) times daily with a meal.    Historical Provider, MD  losartan (COZAAR) 100 MG tablet Take 100 mg by mouth daily. For HTN    Historical  Provider, MD  lovastatin (MEVACOR) 40 MG tablet Take 40 mg by mouth at bedtime. To lower cholesterol    Historical Provider, MD  metFORMIN (GLUCOPHAGE) 500 MG tablet Take 1,000 mg by mouth 2 (two) times daily with a meal. For diabetes    Historical Provider, MD  methocarbamol (ROBAXIN) 500 MG tablet Take 500 mg by mouth 2 (two) times daily with a meal. For muscle spasms    Historical Provider, MD  traMADol (ULTRAM) 50 MG tablet Take one tablet by mouth three times daily as needed for pain 11/11/13   Tiffany L Reed, DO   BP 149/46 mmHg  Pulse 72  Temp(Src) 98.5 F (36.9 C) (Oral)  Resp 22  Ht 5\' 8"  (1.727 m)  Wt 130 lb (58.968 kg)  BMI 19.77 kg/m2  SpO2 100% Physical Exam  Constitutional: She is oriented to person, place, and time. She appears well-developed and well-nourished. No distress.  HENT:  Head: Normocephalic and atraumatic.  Eyes: Pupils are equal, round, and reactive to light.  Neck: Normal range of motion.  Cardiovascular: Normal rate and intact distal pulses.   Pulmonary/Chest: No respiratory distress.  Abdominal: Normal appearance. She exhibits no distension.  Musculoskeletal: Normal range of motion.  Neurological: She is alert and oriented to person, place, and time. No cranial nerve deficit.  Skin: Skin is  warm and dry. No rash noted.  Psychiatric: She has a normal mood and affect. Her behavior is normal.  Nursing note and vitals reviewed.   ED Course  Procedures   Medications  aspirin tablet 325 mg (not administered)  heparin ADULT infusion 100 units/mL (25000 units/250 mL) (700 Units/hr Intravenous Transfusing/Transfer 01/12/15 2159)  nitroGLYCERIN 50 mg in dextrose 5 % 250 mL (0.2 mg/mL) infusion (0 mcg/min Intravenous Transfusing/Transfer 01/12/15 2159)  heparin bolus via infusion 3,500 Units (not administered)  aspirin 81 MG chewable tablet (405 mg  Given 01/12/15 2139)   CRITICAL CARE Performed by: Janet Nay L Total critical care time: 30 min Critical  care time was exclusive of separately billable procedures and treating other patients. Critical care was necessary to treat or prevent imminent or life-threatening deterioration. Critical care was time spent personally by me on the following activities: development of treatment plan with patient and/or surrogate as well as nursing, discussions with consultants, evaluation of patient's response to treatment, examination of patient, obtaining history from patient or surrogate, ordering and performing treatments and interventions, ordering and review of laboratory studies, ordering and review of radiographic studies, pulse oximetry and re-evaluation of patient's condition.  DIAGNOSTIC STUDIES:   COORDINATION OF CARE: 9:24 PM Discussed treatment plan with pt and her nephew. They agreed to plan.   Labs Review Labs Reviewed  CBC - Abnormal; Notable for the following:    Hemoglobin 11.0 (*)    HCT 34.7 (*)    RDW 15.7 (*)    All other components within normal limits  DIFFERENTIAL - Abnormal; Notable for the following:    Monocytes Relative 13 (*)    All other components within normal limits  BASIC METABOLIC PANEL - Abnormal; Notable for the following:    Sodium 133 (*)    Potassium 3.2 (*)    Glucose, Bld 158 (*)    GFR calc non Af Amer 74 (*)    GFR calc Af Amer 85 (*)    All other components within normal limits  I-STAT TROPOININ, ED - Abnormal; Notable for the following:    Troponin i, poc 0.41 (*)    All other components within normal limits  PROTIME-INR  APTT  HEPARIN LEVEL (UNFRACTIONATED)  CBC    Imaging Review No results found.   EKG Interpretation None      Date: 01/12/2015  Rate: 52  Rhythm: normal sinus rhythm  QRS Axis: normal  Intervals: normal  ST/T Wave abnormalities: Inferior ST elevation  Conduction Disutrbances: none  Narrative Interpretation: Acute ST elevation MI     MDM  I discussed the case with cardiology who recommended patient be transferred  to [facility]. Final diagnoses:  Acute myocardial infarction of inferior wall       Janet Nay, MD 01/12/15 2306

## 2015-01-12 NOTE — Progress Notes (Signed)
ANTICOAGULATION CONSULT NOTE - Initial Consult  Pharmacy Consult for Heparin Indication: chest pain/ACS  Allergies  Allergen Reactions  . Aspirin Nausea And Vomiting  . Codeine Nausea And Vomiting  . Influenza Vaccines     Patient Measurements: Height: 5\' 8"  (172.7 cm) Weight: 130 lb (58.968 kg) IBW/kg (Calculated) : 63.9 Heparin Dosing Weight: 59  Vital Signs:    Labs: No results for input(s): HGB, HCT, PLT, APTT, LABPROT, INR, HEPARINUNFRC, CREATININE, CKTOTAL, CKMB, TROPONINI in the last 72 hours.  CrCl cannot be calculated (Unknown ideal weight.).   Medical History: Past Medical History  Diagnosis Date  . Hypertension   . Diabetes mellitus without complication   . Atrial fibrillation     Medications:   (Not in a hospital admission) Scheduled:  . aspirin      . aspirin  325 mg Oral STAT  . heparin      . heparin      . heparin  60 Units/kg Intravenous Once  . nitroGLYCERIN       Infusions:  . heparin      Assessment: 81yo female with history of DM2 and HTN [facility] with constant, moderate, central chest pain. Pharmacy is consulted to dose heparin for ACS/chest pain. Labs are pending and [facility]  Goal of Therapy:  Heparin level 0.3-0.7 units/ml Monitor platelets by anticoagulation protocol: Yes   Plan:  Give 3500 units bolus x 1 Start heparin infusion at 700 units/hr Check anti-Xa level in 8 hours and daily while on heparin Continue to monitor H&H and platelets  Arlean Hoppingorey M. Newman PiesBall, PharmD Clinical Pharmacist Pager [phone] 01/12/2015,9:35 PM

## 2015-01-13 ENCOUNTER — Encounter (HOSPITAL_COMMUNITY): Payer: Self-pay | Admitting: Interventional Cardiology

## 2015-01-13 DIAGNOSIS — I213 ST elevation (STEMI) myocardial infarction of unspecified site: Secondary | ICD-10-CM

## 2015-01-13 DIAGNOSIS — I4891 Unspecified atrial fibrillation: Secondary | ICD-10-CM

## 2015-01-13 DIAGNOSIS — E785 Hyperlipidemia, unspecified: Secondary | ICD-10-CM

## 2015-01-13 DIAGNOSIS — R011 Cardiac murmur, unspecified: Secondary | ICD-10-CM

## 2015-01-13 LAB — CBC
HCT: 28.5 % — ABNORMAL LOW (ref 36.0–46.0)
Hemoglobin: 9.2 g/dL — ABNORMAL LOW (ref 12.0–15.0)
MCH: 26.4 pg (ref 26.0–34.0)
MCHC: 32.3 g/dL (ref 30.0–36.0)
MCV: 81.9 fL (ref 78.0–100.0)
Platelets: 181 10*3/uL (ref 150–400)
RBC: 3.48 MIL/uL — AB (ref 3.87–5.11)
RDW: 15.9 % — AB (ref 11.5–15.5)
WBC: 5 10*3/uL (ref 4.0–10.5)

## 2015-01-13 LAB — MRSA PCR SCREENING: MRSA by PCR: NEGATIVE

## 2015-01-13 LAB — BASIC METABOLIC PANEL
Anion gap: 6 (ref 5–15)
BUN: 9 mg/dL (ref 6–23)
CALCIUM: 8.6 mg/dL (ref 8.4–10.5)
CO2: 26 mmol/L (ref 19–32)
CREATININE: 0.55 mg/dL (ref 0.50–1.10)
Chloride: 102 mmol/L (ref 96–112)
GFR calc Af Amer: >90 mL/min (ref >90)
GFR, EST NON AFRICAN AMERICAN: 81 mL/min — AB (ref >90)
Glucose, Bld: 92 mg/dL (ref 70–99)
Potassium: 3.2 mmol/L — ABNORMAL LOW (ref 3.5–5.1)
SODIUM: 134 mmol/L — AB (ref 135–145)

## 2015-01-13 LAB — GLUCOSE, CAPILLARY
GLUCOSE-CAPILLARY: 153 mg/dL — AB (ref 70–99)
GLUCOSE-CAPILLARY: 125 mg/dL — AB (ref 70–99)
Glucose-Capillary: 165 mg/dL — ABNORMAL HIGH (ref 70–99)
Glucose-Capillary: 161 mg/dL — ABNORMAL HIGH (ref 70–99)

## 2015-01-13 LAB — CK TOTAL AND CKMB (NOT AT ARMC)
CK TOTAL: 967 U/L — AB (ref 7–177)
CK, MB: 134.9 ng/mL (ref 0.3–4.0)
CK, MB: 137.3 ng/mL (ref 0.3–4.0)
RELATIVE INDEX: 14.1 — AB (ref 0.0–2.5)
Relative Index: 14.2 — ABNORMAL HIGH (ref 0.0–2.5)
Total CK: 956 U/L — ABNORMAL HIGH (ref 7–177)

## 2015-01-13 LAB — TROPONIN I
TROPONIN I: 48.81 ng/mL — AB (ref <0.031)
TROPONIN I: 46.1 ng/mL — AB (ref <0.031)
TROPONIN I: 30.99 ng/mL — AB (ref <0.031)
Troponin I: 56.23 ng/mL (ref <0.031)

## 2015-01-13 LAB — POCT ACTIVATED CLOTTING TIME: ACTIVATED CLOTTING TIME: 417 s

## 2015-01-13 MED ORDER — AMLODIPINE BESYLATE 10 MG PO TABS
10.0000 mg | ORAL_TABLET | Freq: Every day | ORAL | Status: DC
  Filled 2015-01-13: qty 1

## 2015-01-13 MED ORDER — HYDRALAZINE HCL 20 MG/ML IJ SOLN
10.0000 mg | Freq: Once | INTRAMUSCULAR | Status: AC
  Administered 2015-01-13: 10 mg via INTRAVENOUS
  Filled 2015-01-13: qty 1

## 2015-01-13 MED ORDER — CARVEDILOL 3.125 MG PO TABS
3.1250 mg | ORAL_TABLET | Freq: Once | ORAL | Status: AC
  Administered 2015-01-13: 3.125 mg via ORAL
  Filled 2015-01-13: qty 1

## 2015-01-13 MED ORDER — ATORVASTATIN CALCIUM 40 MG PO TABS
40.0000 mg | ORAL_TABLET | Freq: Every day | ORAL | Status: DC
  Administered 2015-01-13 – 2015-01-19 (×7): 40 mg via ORAL
  Filled 2015-01-13 (×8): qty 1

## 2015-01-13 MED ORDER — ONDANSETRON HCL 4 MG/2ML IJ SOLN
4.0000 mg | Freq: Four times a day (QID) | INTRAMUSCULAR | Status: DC | PRN
  Administered 2015-01-13 – 2015-01-15 (×5): 4 mg via INTRAVENOUS
  Filled 2015-01-13 (×5): qty 2

## 2015-01-13 MED ORDER — INSULIN ASPART 100 UNIT/ML ~~LOC~~ SOLN
0.0000 [IU] | SUBCUTANEOUS | Status: DC
  Administered 2015-01-13 (×3): 3 [IU] via SUBCUTANEOUS
  Administered 2015-01-13: 2 [IU] via SUBCUTANEOUS

## 2015-01-13 MED ORDER — SODIUM CHLORIDE 0.9 % IV SOLN
1.0000 mL/kg/h | INTRAVENOUS | Status: AC
  Administered 2015-01-13: 1 mL/kg/h via INTRAVENOUS

## 2015-01-13 MED ORDER — LOSARTAN POTASSIUM 50 MG PO TABS
50.0000 mg | ORAL_TABLET | Freq: Every day | ORAL | Status: DC
  Administered 2015-01-13: 50 mg via ORAL
  Filled 2015-01-13 (×2): qty 1

## 2015-01-13 MED ORDER — ACETAMINOPHEN 325 MG PO TABS
650.0000 mg | ORAL_TABLET | ORAL | Status: DC | PRN
  Administered 2015-01-13: 650 mg via ORAL
  Filled 2015-01-13: qty 2

## 2015-01-13 MED ORDER — CLOPIDOGREL BISULFATE 75 MG PO TABS
75.0000 mg | ORAL_TABLET | Freq: Every day | ORAL | Status: DC
  Administered 2015-01-13 – 2015-01-19 (×7): 75 mg via ORAL
  Filled 2015-01-13 (×7): qty 1

## 2015-01-13 MED ORDER — NITROGLYCERIN IN D5W 200-5 MCG/ML-% IV SOLN: 0.0000 ug/min | INTRAVENOUS | Status: DC

## 2015-01-13 MED ORDER — TRAMADOL HCL 50 MG PO TABS
50.0000 mg | ORAL_TABLET | Freq: Four times a day (QID) | ORAL | Status: DC
  Administered 2015-01-13 – 2015-01-17 (×15): 50 mg via ORAL
  Filled 2015-01-13 (×15): qty 1

## 2015-01-13 MED ORDER — METHOCARBAMOL 500 MG PO TABS
500.0000 mg | ORAL_TABLET | Freq: Two times a day (BID) | ORAL | Status: DC
  Administered 2015-01-13 – 2015-01-17 (×9): 500 mg via ORAL
  Filled 2015-01-13 (×10): qty 1

## 2015-01-13 MED ORDER — ASPIRIN 81 MG PO CHEW
81.0000 mg | CHEWABLE_TABLET | Freq: Every day | ORAL | Status: DC
  Administered 2015-01-13 – 2015-01-15 (×3): 81 mg via ORAL
  Filled 2015-01-13 (×3): qty 1

## 2015-01-13 MED ORDER — AMLODIPINE BESYLATE 5 MG PO TABS
5.0000 mg | ORAL_TABLET | Freq: Every day | ORAL | Status: DC
Start: 2015-01-13 — End: 2015-01-19
  Administered 2015-01-13 – 2015-01-19 (×7): 5 mg via ORAL
  Filled 2015-01-13: qty 2
  Filled 2015-01-13 (×6): qty 1

## 2015-01-13 MED ORDER — PRAVASTATIN SODIUM 20 MG PO TABS
20.0000 mg | ORAL_TABLET | Freq: Every day | ORAL | Status: DC
  Filled 2015-01-13: qty 1

## 2015-01-13 MED ORDER — ATROPINE SULFATE 0.1 MG/ML IJ SOLN
INTRAMUSCULAR | Status: AC
  Filled 2015-01-13: qty 10

## 2015-01-13 MED ORDER — INSULIN ASPART 100 UNIT/ML ~~LOC~~ SOLN
0.0000 [IU] | Freq: Three times a day (TID) | SUBCUTANEOUS | Status: DC
  Administered 2015-01-14: 3 [IU] via SUBCUTANEOUS
  Administered 2015-01-14 – 2015-01-16 (×4): 2 [IU] via SUBCUTANEOUS

## 2015-01-13 MED ORDER — CARVEDILOL 3.125 MG PO TABS
3.1250 mg | ORAL_TABLET | Freq: Two times a day (BID) | ORAL | Status: DC
  Administered 2015-01-13: 3.125 mg via ORAL
  Filled 2015-01-13 (×3): qty 1

## 2015-01-13 MED ORDER — CARVEDILOL 6.25 MG PO TABS
6.2500 mg | ORAL_TABLET | Freq: Two times a day (BID) | ORAL | Status: DC
  Administered 2015-01-13 – 2015-01-14 (×2): 6.25 mg via ORAL
  Filled 2015-01-13 (×4): qty 1

## 2015-01-13 NOTE — Progress Notes (Signed)
eLink Physician-Brief Progress Note Patient Name: Janet Bond DOB: 08/13/1923 MRN: [id number]   Date of Service  01/13/2015  HPI/Events of Note  [age] F with PMH of HTN/DM/AF presenting with inferior STEMI.  To cath lab now s/p aspiration thrombectomy and balloon angioplasty.  Patient is alert, HD stable.    eICU Interventions  Plan of care per cardiology service Have added q4 SSI since DM [facility]     Intervention Category Evaluation Type: New Patient Evaluation  Nehemiah Mcfarren 01/13/2015, 12:34 AM

## 2015-01-13 NOTE — Progress Notes (Signed)
Patient transferred to 2H02 from cath- lab around 00:00. Patient in a-fib upon arrival in room with a heart rate of 80s-90s. MD notified, no new orders given. Patient converted to NSR at 02:23.

## 2015-01-13 NOTE — Progress Notes (Signed)
SUBJECTIVE:  Still has some mild residual chest pressure but improved from when she came in  OBJECTIVE:   Vitals:   Filed Vitals:   01/13/15 0600 01/13/15 0700 01/13/15 0800 01/13/15 0820  BP: 117/41 133/76 116/48 144/63  Pulse: 88 82 80 82  Temp:      TempSrc:      Resp: Height:      Weight:      SpO2: 95% 100% 96%    I&O's:   Intake/Output Summary (Last 24 hours) at 01/13/15 0849 Last data filed at 01/13/15 0700  Gross per 24 hour  Intake 474.84 ml  Output   1950 ml  Net -1475.16 ml   TELEMETRY: Reviewed telemetry pt in NSR:     PHYSICAL EXAM General: Well developed, well nourished, in no acute distress  Lungs:   Clear bilaterally to auscultation and percussion. Heart:   HRRR S1 S2 Pulses are 2+ & equal. 2/6 SM at RUSB Abdomen: Bowel sounds are positive, abdomen soft and non-tender without masses Extremities:   No clubbing, cyanosis or edema.  DP +1 Neuro: Alert and oriented X 3. Psych:  Good affect, responds appropriately   LABS: Basic Metabolic Panel:  Recent Labs  16/10/96 2139 01/13/15 0430  NA 133* 134*  K 3.2* 3.2*  CL 98 102  CO2 28 26  GLUCOSE 158* 92  BUN 15 9  CREATININE 0.73 0.55  CALCIUM 8.9 8.6   Liver Function Tests: No results for input(s): AST, ALT, ALKPHOS, BILITOT, PROT, ALBUMIN in the last 72 hours. No results for input(s): LIPASE, AMYLASE in the last 72 hours. CBC:  Recent Labs  01/12/15 2139 01/13/15 0430  WBC 5.2 5.0  NEUTROABS 2.9  --   HGB 11.0* 9.2*  HCT 34.7* 28.5*  MCV 82.0 81.9  PLT 191 181   Cardiac Enzymes:  Recent Labs  01/13/15 0215 01/13/15 0430  CKTOTAL 956* 967*  CKMB 134.9* 137.3*  TROPONINI 56.23*  --    BNP: Invalid input(s): POCBNP D-Dimer: No results for input(s): DDIMER in the last 72 hours. Hemoglobin A1C: No results for input(s): HGBA1C in the last 72 hours. Fasting Lipid Panel: No results for input(s): CHOL, HDL, LDLCALC, TRIG, CHOLHDL, LDLDIRECT in the last 72  hours. Thyroid Function Tests: No results for input(s): TSH, T4TOTAL, T3FREE, THYROIDAB in the last 72 hours.  Invalid input(s): FREET3 Anemia Panel: No results for input(s): VITAMINB12, FOLATE, FERRITIN, TIBC, IRON, RETICCTPCT in the last 72 hours. Coag Panel:   Lab Results  Component Value Date   INR 0.96 01/12/2015    RADIOLOGY: No results found.  ASSESSMENT/PLAN: 1.  Acute inferior STEMI - s/p Cath with 50-60% prox LAD, moderate prox stenosis of D2, moderate stenosis of the LCX, occluded PL proximally s/p aspiration thrombectomy and PTCA of the PL.  No stent was placed due to advanced age and not wanting to commit to DAPT.  Continue ASA/Plavix/statin/BB.  Continue to cycle Troponin until it peaks.  Check 2D echo today to assess LVF. Still having some mild CP most likely from distal embolization so will continue NTG gtt and try to wean as tolerates.   2.  HTN - controlled on amlodipine/Coreg.  Would like to titrate the BB so will increase Coreg to 6.25mg  BID, decrease amlodipine to  daily and restart Losartan at  daily and titrated back to home dose if BP tolerates.   3.  DM - Metformin on hold for 48 hours post cath.  Cover with  SS Insulin 4.  H/O afib in the past - not on anticoagulation in the past.  Her CHADS2VASC score is 6.  Will need to find out if she truly has had PAF in the past.   5.  Hypokalemia - replete and follow BMET 6.  Anemia ? Of chronic disease.  Heme check stools.  Follow CBC.  7.  Dyslipidemia - change to Lipitor 40mg  daily.  Will need repeat FLP and ALT in 6 weeks   Quintella ReichertURNER,TRACI R, MD  01/13/2015  8:49 AM

## 2015-01-13 NOTE — Progress Notes (Signed)
Echocardiogram 2D Echocardiogram has been performed.  Dorothey BasemanReel, Mauriah Mcmillen M 01/13/2015, 4:23 PM

## 2015-01-13 NOTE — Progress Notes (Signed)
Notified Waldo Lainehonda Barrett,P.A. regarding cardiac rhythm changes (See saved strips).  Heart rate tachycardic 116s then 60s - 70s and NSR with 1st degree heartblock.  Patient asymptomatic when changes occur.  No new orders given will continue to monitor for any changes.

## 2015-01-13 NOTE — Progress Notes (Signed)
Right Femoral Arterial and Venous sheaths D/C with 40 minutes of pressure held. Patient received IV Zofran prior to pulling sheaths. After 20 minutes of pressure being held patient became nauseated and vomited large amount of clear emesis. Cool wash cloth to forehead. Instructed to keep head on the pillow and leg straight. Right femoral area began as a zero and ended with a zero. Site did become slightly puffy to the anterior site during vomiting period. Extra pressure held to site with site ended as very soft. Pedals began and ended as 2 +. Will continue to monitor closely. VSS.

## 2015-01-13 NOTE — Progress Notes (Signed)
   01/13/15 1100  Clinical Encounter Type  Visited With Patient;Health care provider  Visit Type Initial;Critical Care   Chaplain was referred to patient via spiritual care consult. Consult indicated patient would like to create or update an advanced directive. Chaplain visited with patient today and asked her if she was interested in getting help with an advanced directive. Patient thinks that one of her family members, possibly a granddaughter, already has the paperwork for an advanced directive but has not yet finished it yet. Chaplain let patient know that if her family member returns to the hospital and they still need help completing the documents that he could come back and provide assistance.Cranston NeighborStrother, Lesieli Bresee R, Chaplain  11:20 AM

## 2015-01-13 NOTE — Care Management Note (Addendum)
    Page 1 of 1   01/19/2015     11:44:50 AM CARE MANAGEMENT NOTE 01/19/2015  Patient:  Janet Bond,Janet Bond   Account Number:  [id number]  Date Initiated:  01/13/2015  Documentation initiated by:  Junius CreamerWELL,DEBBIE  Subjective/Objective Assessment:   adm w pos troponins     Action/Plan:   lives at home, has supp nephew   Anticipated DC Date:     Anticipated DC Plan:  HOME/SELF CARE  In-house referral  Clinical Social Worker      DC Associate Professorlanning Services  CM consult      Choice offered to / List presented to:             Status of service:  Completed, signed off Medicare Important Message given?  YES (If response is "NO", the following Medicare IM given date fields will be blank) Date Medicare IM given:  01/16/2015 Medicare IM given by:  GRAVES-BIGELOW,BRENDA Date Additional Medicare IM given:  01/19/2015 Additional Medicare IM given by:  Raynald BlendSAMANTHA CLAXTON  Discharge Disposition:  SKILLED NURSING FACILITY  Per UR Regulation:  Reviewed for med. necessity/level of care/duration of stay  If discussed at Long Length of Stay Meetings, dates discussed:    Comments:  01-16-15 1207 Tomi BambergerBrenda Graves-Bigelow, RN,BSN [phone] CM did speak with pt in regards to disposition needs. Pt has family support of grandchildren that check in on her. PT recommendations for SNF. CM did make CSW aware. NO further needs from CM at this time.

## 2015-01-14 LAB — BASIC METABOLIC PANEL
Anion gap: 7 (ref 5–15)
BUN: 11 mg/dL (ref 6–23)
CALCIUM: 9.1 mg/dL (ref 8.4–10.5)
CO2: 24 mmol/L (ref 19–32)
Chloride: 97 mmol/L (ref 96–112)
Creatinine, Ser: 0.91 mg/dL (ref 0.50–1.10)
GFR calc Af Amer: 63 mL/min — ABNORMAL LOW (ref >90)
GFR, EST NON AFRICAN AMERICAN: 54 mL/min — AB (ref >90)
GLUCOSE: 162 mg/dL — AB (ref 70–99)
Potassium: 3.9 mmol/L (ref 3.5–5.1)
SODIUM: 128 mmol/L — AB (ref 135–145)

## 2015-01-14 LAB — CBC
HCT: 30.9 % — ABNORMAL LOW (ref 36.0–46.0)
HEMOGLOBIN: 9.7 g/dL — AB (ref 12.0–15.0)
MCH: 26.1 pg (ref 26.0–34.0)
MCHC: 31.4 g/dL (ref 30.0–36.0)
MCV: 83.1 fL (ref 78.0–100.0)
Platelets: 187 10*3/uL (ref 150–400)
RBC: 3.72 MIL/uL — ABNORMAL LOW (ref 3.87–5.11)
RDW: 16.1 % — ABNORMAL HIGH (ref 11.5–15.5)
WBC: 5.7 10*3/uL (ref 4.0–10.5)

## 2015-01-14 LAB — GLUCOSE, CAPILLARY: Glucose-Capillary: 146 mg/dL — ABNORMAL HIGH (ref 70–99)

## 2015-01-14 MED ORDER — METFORMIN HCL 500 MG PO TABS
1000.0000 mg | ORAL_TABLET | Freq: Two times a day (BID) | ORAL | Status: DC
  Administered 2015-01-16 – 2015-01-19 (×8): 1000 mg via ORAL
  Filled 2015-01-14 (×10): qty 2

## 2015-01-14 MED ORDER — METFORMIN HCL 500 MG PO TABS
1000.0000 mg | ORAL_TABLET | Freq: Two times a day (BID) | ORAL | Status: DC
  Filled 2015-01-14 (×2): qty 2

## 2015-01-14 MED ORDER — LOSARTAN POTASSIUM 50 MG PO TABS
100.0000 mg | ORAL_TABLET | Freq: Every day | ORAL | Status: DC
  Administered 2015-01-14 – 2015-01-19 (×6): 100 mg via ORAL
  Filled 2015-01-14 (×6): qty 2

## 2015-01-14 MED ORDER — HEPARIN SODIUM (PORCINE) 5000 UNIT/ML IJ SOLN
5000.0000 [IU] | Freq: Three times a day (TID) | INTRAMUSCULAR | Status: DC
  Administered 2015-01-14 – 2015-01-19 (×15): 5000 [IU] via SUBCUTANEOUS
  Filled 2015-01-14 (×13): qty 1

## 2015-01-14 MED ORDER — CARVEDILOL 12.5 MG PO TABS
12.5000 mg | ORAL_TABLET | Freq: Two times a day (BID) | ORAL | Status: DC
Start: 2015-01-14 — End: 2015-01-19
  Administered 2015-01-14 – 2015-01-19 (×11): 12.5 mg via ORAL
  Filled 2015-01-14 (×12): qty 1

## 2015-01-14 MED FILL — Heparin Sodium (Porcine) 2 Unit/ML in Sodium Chloride 0.9%: INTRAMUSCULAR | Qty: 1500 | Status: AC

## 2015-01-14 MED FILL — Ondansetron HCl Inj 4 MG/2ML (2 MG/ML): INTRAMUSCULAR | Qty: 2 | Status: AC

## 2015-01-14 MED FILL — Lidocaine HCl Local Preservative Free (PF) Inj 1%: INTRAMUSCULAR | Qty: 30 | Status: AC

## 2015-01-14 MED FILL — Clopidogrel Bisulfate Tab 300 MG (Base Equiv): ORAL | Qty: 3 | Status: AC

## 2015-01-14 MED FILL — Bivalirudin For IV Soln 250 MG: INTRAVENOUS | Qty: 250 | Status: AC

## 2015-01-14 MED FILL — Sodium Chloride IV Soln 0.9%: INTRAVENOUS | Qty: 50 | Status: AC

## 2015-01-14 MED FILL — Nitroglycerin IV Soln 100 MCG/ML in D5W: INTRA_ARTERIAL | Qty: 10 | Status: AC

## 2015-01-14 NOTE — Progress Notes (Addendum)
SUBJECTIVE:  Chest pain and nausea have resolved and she feels much better  OBJECTIVE:   Vitals:   Filed Vitals:   01/14/15 0200 01/14/15 0400 01/14/15 0600 01/14/15 0800  BP: 164/67 178/75 172/76 175/69  Pulse: 84 75 77 75  Temp:  98.4 F (36.9 C)    TempSrc:  Oral  Oral  Resp: 11 12 12 14   Height:      Weight:      SpO2: 95% 96% 96% 96%   I&O's:   Intake/Output Summary (Last 24 hours) at 01/14/15 [id number] Last data filed at 01/14/15 0800  Gross per 24 hour  Intake 927.63 ml  Output    100 ml  Net 827.63 ml   TELEMETRY: Reviewed telemetry pt in NSR:     PHYSICAL EXAM General: Well developed, well nourished, in no acute distress Head: Eyes PERRLA, No xanthomas.   Normal cephalic and atramatic  Lungs:   Clear bilaterally to auscultation and percussion. Heart:   HRRR S1 S2 Pulses are 2+ & equal. Abdomen: Bowel sounds are positive, abdomen soft and non-tender without masses  Extremities:   No clubbing, cyanosis or edema.  DP +1 Neuro: Alert and oriented X 3. Psych:  Good affect, responds appropriately   LABS: Basic Metabolic Panel:  Recent Labs  40/98/1104/18/16 2139 01/13/15 0430  NA 133* 134*  K 3.2* 3.2*  CL 98 102  CO2 28 26  GLUCOSE 158* 92  BUN 15 9  CREATININE 0.73 0.55  CALCIUM 8.9 8.6   Liver Function Tests: No results for input(s): AST, ALT, ALKPHOS, BILITOT, PROT, ALBUMIN in the last 72 hours. No results for input(s): LIPASE, AMYLASE in the last 72 hours. CBC:  Recent Labs  01/12/15 2139 01/13/15 0430 01/14/15 0253  WBC 5.2 5.0 5.7  NEUTROABS 2.9  --   --   HGB 11.0* 9.2* 9.7*  HCT 34.7* 28.5* 30.9*  MCV 82.0 81.9 83.1  PLT 191 181 187   Cardiac Enzymes:  Recent Labs  01/13/15 0215 01/13/15 0430 01/13/15 1125 01/13/15 1600 01/13/15 2116  CKTOTAL 956* 967*  --   --   --   CKMB 134.9* 137.3*  --   --   --   TROPONINI 56.23*  --  48.81* 46.10* 30.99*   BNP: Invalid input(s): POCBNP D-Dimer: No results for input(s): DDIMER in the  last 72 hours. Hemoglobin A1C: No results for input(s): HGBA1C in the last 72 hours. Fasting Lipid Panel: No results for input(s): CHOL, HDL, LDLCALC, TRIG, CHOLHDL, LDLDIRECT in the last 72 hours. Thyroid Function Tests: No results for input(s): TSH, T4TOTAL, T3FREE, THYROIDAB in the last 72 hours.  Invalid input(s): FREET3 Anemia Panel: No results for input(s): VITAMINB12, FOLATE, FERRITIN, TIBC, IRON, RETICCTPCT in the last 72 hours. Coag Panel:   Lab Results  Component Value Date   INR 0.96 01/12/2015    RADIOLOGY: No results found.  ASSESSMENT/PLAN: 1. Acute inferior STEMI - s/p Cath with 50-60% prox LAD, moderate prox stenosis of D2, moderate stenosis of the LCX, occluded PL proximally s/p aspiration thrombectomy and PTCA of the PL. No stent was placed due to advanced age and not wanting to commit to DAPT. Continue ASA/Plavix/statin/BB.  2D echo with normal LVF. 2. HTN - poorly controlled on amlodipine/Coreg/Losartan. Would like to titrate the BB so will increase Coreg to 12.5mg  BID, continue amlodipine 5mg  daily and increase Losartan back to home dose of 100mg  daily. 3. DM - Restart Metformin tomorrow am. Cover with SS Insulin 4. H/O afib  in the past - not on anticoagulation in the past. Her CHADS2VASC score is 6. Will need to find out if she truly has had PAF in the past.  5. Hypokalemia - check BMET this am 6. Anemia ? Of chronic disease. Heme check stools. Hgb stable 7. Dyslipidemia - Continue Lipitor  daily. Will need repeat FLP and ALT in 6 weeks 8.  DVT prophylaxis - SQ Heparin per pharmacy 9. Mild AS by echo  Stable for transfer to tele bed.  Janet Reichert, MD  01/14/2015  9:05 AM

## 2015-01-14 NOTE — Progress Notes (Signed)
CARDIAC REHAB PHASE I   PRE:  Rate/Rhythm: 87 first degree with PAC    BP: sitting 157/65    SaO2: 99 RA  MODE:  Ambulation: 150 ft   POST:  Rate/Rhythm: 95 SR    BP: sitting 141/87     SaO2: 97 RA  Pt able to walk with RW, assist x1. Slow pace, sts she has orthopedic issues and doesn't walk much. Uses RW sometimes. Tired after walk but denied CP. (Sts she had CP earlier today, relieved with "medicine" and burping). To recliner. Discussed MI, taking meds, NTG. Voiced understanding. Lives alone but sts her family can help her if need be. 0950-1040   Elissa [id number], Trinda Harlacher ReightownKristan CES, ACSM 01/14/2015 10:43 AM

## 2015-01-14 NOTE — Progress Notes (Signed)
Report called to receiving rn [id number]. Patient with no complaints at the current time. Will trasfer via WC.

## 2015-01-15 LAB — GLUCOSE, CAPILLARY
GLUCOSE-CAPILLARY: 88 mg/dL (ref 70–99)
GLUCOSE-CAPILLARY: 154 mg/dL — AB (ref 70–99)
GLUCOSE-CAPILLARY: 123 mg/dL — AB (ref 70–99)
GLUCOSE-CAPILLARY: 113 mg/dL — AB (ref 70–99)
Glucose-Capillary: 148 mg/dL — ABNORMAL HIGH (ref 70–99)
Glucose-Capillary: 125 mg/dL — ABNORMAL HIGH (ref 70–99)
Glucose-Capillary: 117 mg/dL — ABNORMAL HIGH (ref 70–99)
Glucose-Capillary: 109 mg/dL — ABNORMAL HIGH (ref 70–99)
Glucose-Capillary: 146 mg/dL — ABNORMAL HIGH (ref 70–99)
Glucose-Capillary: 130 mg/dL — ABNORMAL HIGH (ref 70–99)

## 2015-01-15 LAB — CBC
HCT: 32.7 % — ABNORMAL LOW (ref 36.0–46.0)
Hemoglobin: 10.2 g/dL — ABNORMAL LOW (ref 12.0–15.0)
MCH: 26.1 pg (ref 26.0–34.0)
MCHC: 31.2 g/dL (ref 30.0–36.0)
MCV: 83.6 fL (ref 78.0–100.0)
PLATELETS: 182 10*3/uL (ref 150–400)
RBC: 3.91 MIL/uL (ref 3.87–5.11)
RDW: 16.3 % — AB (ref 11.5–15.5)
WBC: 4.5 10*3/uL (ref 4.0–10.5)

## 2015-01-15 LAB — BASIC METABOLIC PANEL
Anion gap: 9 (ref 5–15)
BUN: 14 mg/dL (ref 6–23)
CALCIUM: 9 mg/dL (ref 8.4–10.5)
CO2: 22 mmol/L (ref 19–32)
Chloride: 99 mmol/L (ref 96–112)
Creatinine, Ser: 0.98 mg/dL (ref 0.50–1.10)
GFR calc Af Amer: 58 mL/min — ABNORMAL LOW (ref >90)
GFR calc non Af Amer: 50 mL/min — ABNORMAL LOW (ref >90)
GLUCOSE: 134 mg/dL — AB (ref 70–99)
Potassium: 4.6 mmol/L (ref 3.5–5.1)
Sodium: 130 mmol/L — ABNORMAL LOW (ref 135–145)

## 2015-01-15 MED ORDER — GI COCKTAIL ~~LOC~~
30.0000 mL | Freq: Three times a day (TID) | ORAL | Status: DC | PRN
  Administered 2015-01-15: 30 mL via ORAL
  Filled 2015-01-15: qty 30

## 2015-01-15 MED ORDER — HYDRALAZINE HCL 20 MG/ML IJ SOLN
10.0000 mg | INTRAMUSCULAR | Status: DC | PRN
  Administered 2015-01-15: 10 mg via INTRAVENOUS
  Filled 2015-01-15: qty 1

## 2015-01-15 MED ORDER — PANTOPRAZOLE SODIUM 40 MG PO TBEC
40.0000 mg | DELAYED_RELEASE_TABLET | Freq: Two times a day (BID) | ORAL | Status: DC
  Administered 2015-01-15 – 2015-01-19 (×9): 40 mg via ORAL
  Filled 2015-01-15 (×9): qty 1

## 2015-01-15 MED ORDER — PROMETHAZINE HCL 25 MG/ML IJ SOLN
6.2500 mg | Freq: Four times a day (QID) | INTRAMUSCULAR | Status: DC | PRN
  Administered 2015-01-15: 6.25 mg via INTRAVENOUS
  Filled 2015-01-15: qty 1

## 2015-01-15 MED ORDER — METOPROLOL TARTRATE 1 MG/ML IV SOLN: 2.5000 mg | INTRAVENOUS | Status: DC | PRN

## 2015-01-15 MED ORDER — ASPIRIN EC 81 MG PO TBEC
81.0000 mg | DELAYED_RELEASE_TABLET | Freq: Every day | ORAL | Status: DC
  Administered 2015-01-16 – 2015-01-19 (×4): 81 mg via ORAL
  Filled 2015-01-15 (×4): qty 1

## 2015-01-15 NOTE — Progress Notes (Signed)
1400 Came to walk with pt. She is still nauseated and BP elevated. Would recommend PT consult as pt lives alone but has family to check on her. Will continue to follow. Luetta NuttingCharlene Wilberta Dorvil RN BSN 01/15/2015 2:04 PM

## 2015-01-15 NOTE — Progress Notes (Signed)
Patient Name: Janet Bond Date of Encounter: 01/15/2015  Active Problems:   Acute myocardial infarction of inferior wall   Acute inferior myocardial infarction   Essential hypertension   Type 2 diabetes mellitus with other circulatory complications   Heart murmur   Dyslipidemia, goal LDL below 70    Primary Cardiologist: New  Patient Profile: 81 yo female w/ hx of DM, HTN, admitted 04/18 with acute inferior STEMI. Now s/p cardiac cath and revascularization with aspiration thrombectomy and balloon angioplasty to occluded posterior lateral artery branch of RCA.   SUBJECTIVE: Denies chest pain, shortness of breath. Reports worsening nausea since last night, given zofran this morning with no relief.   OBJECTIVE Filed Vitals:   01/14/15 1714 01/14/15 1743 01/14/15 2020 01/15/15 0546  BP: 179/86 155/78 147/67 176/93  Pulse: 93  66 72  Temp: 98.3 F (36.8 C)  98 F (36.7 C) 98.2 F (36.8 C)  TempSrc: Oral  Oral Oral  Resp: Height:      Weight: 132 lb 3.2 oz (59.966 kg)   133 lb 1.6 oz (60.374 kg)  SpO2: 97%  95% 96%    Intake/Output Summary (Last 24 hours) at 01/15/15 0717 Last data filed at 01/15/15 0500  Gross per 24 hour  Intake    360 ml  Output    850 ml  Net   -490 ml   Filed Weights   01/13/15 0000 01/14/15 1714 01/15/15 0546  Weight: 132 lb 7.9 oz (60.1 kg) 132 lb 3.2 oz (59.966 kg) 133 lb 1.6 oz (60.374 kg)    PHYSICAL EXAM General: Well developed, female in no acute distress. Head: Normocephalic, atraumatic.  Neck: Supple without bruits, JVD not elevated. Lungs: Resp regular and unlabored, CTA. Heart: RRR, S1, S2, no S3, S4, 2/6 systolic murmur RUSB; no rub. Abdomen: Soft, non-tender, non-distended, BS + x 4.  Extremities: No clubbing, cyanosis, trace edema.  Neuro: Alert and oriented X 3. Moves all extremities spontaneously. Psych: Normal affect.  LABS: CBC: Recent Labs  01/12/15 2139  01/14/15 0253 01/15/15 0556  WBC 5.2  < > 5.7  4.5  NEUTROABS 2.9  --   --   --   HGB 11.0*  < > 9.7* 10.2*  HCT 34.7*  < > 30.9* 32.7*  MCV 82.0  < > 83.1 83.6  PLT 191  < > 187 182  < > = values in this interval not displayed. INR: Recent Labs  01/12/15 2139  INR 0.96   Basic Metabolic Panel: Recent Labs  01/13/15 0430 01/14/15 1042  NA 134* 128*  K 3.2* 3.9  CL 102 97  CO2 26 24  GLUCOSE 92 162*  BUN 9 11  CREATININE 0.55 0.91  CALCIUM 8.6 9.1   Cardiac Enzymes: Recent Labs  01/13/15 0215 01/13/15 0430 01/13/15 1125 01/13/15 1600 01/13/15 2116  CKTOTAL 956* 967*  --   --   --   CKMB 134.9* 137.3*  --   --   --   TROPONINI 56.23*  --  48.81* 46.10* 30.99*    Recent Labs  01/12/15 2142  TROPIPOC 0.41*    TELE: SR, 75-80, occasional elevation to 90s         Radiology/Studies: No results found.   Current Medications:  . amLODipine  5 mg Oral Daily  . aspirin  81 mg Oral Daily  . atorvastatin  40 mg Oral q1800  . carvedilol  12.5 mg Oral BID WC  . clopidogrel  75 mg Oral Q breakfast  . heparin subcutaneous  5,000 Units Subcutaneous 3 times per day  . insulin aspart  0-15 Units Subcutaneous TID WC & HS  . losartan  100 mg Oral Daily  . metFORMIN  1,000 mg Oral BID WC  . methocarbamol  500 mg Oral BID WC  . traMADol  50 mg Oral 4 times per day      ASSESSMENT AND PLAN: Active Problems:   Acute inferior myocardial infarction  - Now s/p cardiac cath and revascularization 04/18 with 50-60% stenosis prox LAD, moderate prox stenosis D2, moderate stenosis LCX, occluded PL proximally s/p aspiration thrombectomy and PTCA of PL;no stent placed given patient age and not wanting to commit to dual antiplatelet therapy - Echo 04/19 EF 60-65%, no wall motion abnormalities, grade 2 diastolic dysfunction - Continue ASA, plavix, statin, BB  - Cardiac rehab following    Essential hypertension - SBP 144-187 over last 24 hours - Continue carvedilol 12.5 mg BID, amlodipine 5 mg daily, losartan 100 mg  daily    Type 2 diabetes mellitus with other circulatory complications - Restart home metformin - Continue SSI    Heart murmur -  Echo 04/19 mild AS, mild aortic regurgitation, trivial tricuspid regurgitation      Dyslipidemia, goal LDL below 70 - Continue atorvastatin     Hypokalemia - Potassium 4.6 today (3.9 yesterday, 3.2 on admission 04/18)    Nausea - Given zofran this morning with no resolution of symptoms - Phenergan 6.25 mg q6h PRN - Nausea and vomiting was previous reaction to aspirin, now on ASA/Plavix, discuss with M.D. Change to Coated ASA - after Phenergan, patient very sleepy, continue to monitor     Signed, Theodore DemarkRhonda Barrett , PA-C 7:17 AM 01/15/2015 Patinet seen and examined  I agree with findings as noted by R Barrett.   S/p intervention as noted above Nauseated this AM  Rx as noted  WIll follow.   Continue medical Rx  I am not convinced of any active angina.    Dietrich PatesPaula Apollo Timothy

## 2015-01-15 NOTE — Progress Notes (Signed)
Contacted RN regarding nausea. Patient has been having problems with this for a couple of days. Her by mouth intake has been poor today due to the nausea. Zofran has been ineffective. She was given Phenergan 6.25 milligrams times one dose but became somnolent after that for a prolonged period of time and so this was discontinued.  She has a noted intolerance to aspirin with nausea and vomiting. She has been getting aspirin 81 mg daily plus Plavix.  Have changed the aspirin to enteric-coated, which may help. M.D. review medications and advise if Plavix could be discontinued since she did not get a stent although she did get percutaneous intervention with aspiration thrombectomy and balloon angioplasty. Peak troponin 56.23.   We will add a GI cocktail when necessary. Will add Protonix 40 mg twice a day.  Theodore Demarkhonda Barrett, PA-C 01/15/2015 4:00 PM Beeper [phone]

## 2015-01-15 NOTE — Progress Notes (Signed)
1035 Asked by PA to see pt to walk. Went to see pt and very sleepy. Would open eyes but back to sleep. Got phenergan for nausea. Will follow up later as time permits. Luetta NuttingCharlene Sherissa Tenenbaum RN BSN 01/15/2015 10:37 AM

## 2015-01-15 NOTE — Progress Notes (Signed)
Patient complaining of nausea and weakness. Patient alert and oriented x4. VS and CBG were checked, her bp was 176/93 and CBG was 109. Zofran and ginger ale were given along with emotional support. No complaints of chest pain. Patient states that she would "just feel better if I could throw up." Will continue to monitor.

## 2015-01-15 NOTE — Progress Notes (Signed)
Pt resting in bed bp 190/100. Pa on call made aware. New orders received. Will cont to monitor pt.

## 2015-01-16 ENCOUNTER — Inpatient Hospital Stay (HOSPITAL_COMMUNITY): Payer: Medicare Other

## 2015-01-16 DIAGNOSIS — I639 Cerebral infarction, unspecified: Secondary | ICD-10-CM

## 2015-01-16 LAB — BASIC METABOLIC PANEL
Anion gap: 7 (ref 5–15)
BUN: 15 mg/dL (ref 6–23)
CO2: 29 mmol/L (ref 19–32)
CREATININE: 1.09 mg/dL (ref 0.50–1.10)
Calcium: 8.8 mg/dL (ref 8.4–10.5)
Chloride: 94 mmol/L — ABNORMAL LOW (ref 96–112)
GFR calc non Af Amer: 44 mL/min — ABNORMAL LOW (ref >90)
GFR, EST AFRICAN AMERICAN: 51 mL/min — AB (ref >90)
GLUCOSE: 103 mg/dL — AB (ref 70–99)
Potassium: 3.4 mmol/L — ABNORMAL LOW (ref 3.5–5.1)
Sodium: 130 mmol/L — ABNORMAL LOW (ref 135–145)

## 2015-01-16 LAB — GLUCOSE, CAPILLARY
GLUCOSE-CAPILLARY: 115 mg/dL — AB (ref 70–99)
GLUCOSE-CAPILLARY: 158 mg/dL — AB (ref 70–99)
Glucose-Capillary: 124 mg/dL — ABNORMAL HIGH (ref 70–99)

## 2015-01-16 MED ORDER — STROKE: EARLY STAGES OF RECOVERY BOOK
Freq: Once | Status: AC
  Administered 2015-01-16: 19:00:00
  Filled 2015-01-16: qty 1

## 2015-01-16 NOTE — Progress Notes (Signed)
Patient Profile: 81 yo female w/ hx of DM, HTN, admitted 04/18 with acute inferior STEMI. Now s/p cardiac cath and revascularization with aspiration thrombectomy and balloon angioplasty to occluded posterior lateral artery branch of RCA.   Subjective: No complaints. Feels much better. Nausea resolved. No CP or dyspnea.   Objective: Vital signs in last 24 hours: Temp:  [98 F (36.7 C)-98.7 F (37.1 C)] 98 F (36.7 C) (04/22 0334) Pulse Rate:  [79-101] 79 (04/22 0334) Resp:  [16] 16 (04/22 0334) BP: (119-190)/(55-100) 138/62 mmHg (04/22 0334) SpO2:  [92 %-98 %] 92 % (04/22 0334) Weight:  [124 lb 12.8 oz (56.609 kg)] 124 lb 12.8 oz (56.609 kg) (04/22 0334)    Intake/Output from previous day: 04/21 0701 - 04/22 0700 In: 220 [P.O.:220] Out: 500 [Urine:500] Intake/Output this shift:    Medications Current Facility-Administered Medications  Medication Dose Route Frequency Provider Last Rate Last Dose  . acetaminophen (TYLENOL) tablet 650 mg  650 mg Oral Q4H PRN Corky CraftsJayadeep S Varanasi, MD   650 mg at 01/13/15 1323  . amLODipine (NORVASC) tablet 5 mg  5 mg Oral Daily Quintella Reichertraci R Turner, MD   5 mg at 01/15/15 1015  . aspirin EC tablet 81 mg  81 mg Oral Daily Rhonda G Barrett, PA-C      . atorvastatin (LIPITOR) tablet 40 mg  40 mg Oral q1800 Quintella Reichertraci R Turner, MD   40 mg at 01/15/15 1739  . carvedilol (COREG) tablet 12.5 mg  12.5 mg Oral BID WC Quintella Reichertraci R Turner, MD   12.5 mg at 01/15/15 1739  . clopidogrel (PLAVIX) tablet 75 mg  75 mg Oral Q breakfast Corky CraftsJayadeep S Varanasi, MD   75 mg at 01/15/15 0853  . gi cocktail (Maalox,Lidocaine,Donnatal)  30 mL Oral TID PRN Joline Salthonda G Barrett, PA-C   30 mL at 01/15/15 1739  . heparin injection 5,000 Units  5,000 Units Subcutaneous 3 times per day Quintella Reichertraci R Turner, MD   5,000 Units at 01/16/15 0602  . hydrALAZINE (APRESOLINE) injection 10 mg  10 mg Intravenous Q4H PRN Joline Salthonda G Barrett, PA-C   10 mg at 01/15/15 1353  . insulin aspart (novoLOG) injection 0-15 Units   0-15 Units Subcutaneous TID WC & HS Corky CraftsJayadeep S Varanasi, MD   2 Units at 01/15/15 1759  . losartan (COZAAR) tablet 100 mg  100 mg Oral Daily Quintella Reichertraci R Turner, MD   100 mg at 01/15/15 1015  . metFORMIN (GLUCOPHAGE) tablet 1,000 mg  1,000 mg Oral BID WC Corky CraftsJayadeep S Varanasi, MD   1,000 mg at 01/15/15 0853  . methocarbamol (ROBAXIN) tablet 500 mg  500 mg Oral BID WC Corky CraftsJayadeep S Varanasi, MD   500 mg at 01/15/15 1739  . metoprolol (LOPRESSOR) injection 2.5 mg  2.5 mg Intravenous Q2H PRN Rhonda G Barrett, PA-C      . ondansetron (ZOFRAN) injection 4 mg  4 mg Intravenous Q6H PRN Corky CraftsJayadeep S Varanasi, MD   4 mg at 01/15/15 1455  . pantoprazole (PROTONIX) EC tablet 40 mg  40 mg Oral BID AC Rhonda G Barrett, PA-C   40 mg at 01/15/15 1739  . traMADol (ULTRAM) tablet 50 mg  50 mg Oral 4 times per day Corky CraftsJayadeep S Varanasi, MD   50 mg at 01/16/15 0602    PE: General appearance: alert, cooperative and no distress Neck: no carotid bruit and no JVD Lungs: clear to auscultation bilaterally Heart: regular rate and rhythm and 2/6 SM Extremities: no LEE Pulses: 2+ and symmetric Skin: warm  and dry Neurologic: Grossly normal  Lab Results:   Recent Labs  01/14/15 0253 01/15/15 0556  WBC 5.7 4.5  HGB 9.7* 10.2*  HCT 30.9* 32.7*  PLT 187 182   BMET  Recent Labs  01/14/15 1042 01/15/15 0556  NA 128* 130*  K 3.9 4.6  CL 97 99  CO2 24 22  GLUCOSE 162* 134*  BUN 11 14  CREATININE 0.91 0.98  CALCIUM 9.1 9.0     Assessment/Plan  Active Problems:   Acute myocardial infarction of inferior wall   Acute inferior myocardial infarction   Essential hypertension   Type 2 diabetes mellitus with other circulatory complications   Heart murmur   Dyslipidemia, goal LDL below 70   1. Acute inferior myocardial infarction: - Now s/p cardiac cath and revascularization 04/18 with 50-60% stenosis prox LAD, moderate prox stenosis D2, moderate stenosis LCX, occluded PL proximally s/p aspiration thrombectomy and  PTCA of PL;no stent placed given patient age and not wanting to commit to dual antiplatelet therapy - She is CP free.  - Echo 04/19 EF 60-65%, no wall motion abnormalities, grade 2 diastolic dysfunction - Continue ASA, plavix, statin, BB  - Cardiac rehab following   2. Essential hypertension: - BP better controlled this am. Most recent 138/62. - Continue carvedilol 12.5 mg BID, amlodipine 5 mg daily, losartan 100 mg daily   3.  Type 2 diabetes mellitus: - Restart home metformin - Continue SSI   4.  Heart murmur:  - Echo 04/19 mild AS, mild aortic regurgitation, trivial tricuspid regurgitation    5. Dyslipidemia:   - Continue atorvastatin  - goal LDL is < 70     6.  Nausea:   -  Resolved after ASA change to enteric-coated, GI cocktail and Protonix      7. Deconditioning/Gait Difficulty: -  PT to assess today to determine SNF vs Home Health services. Patient is 81 y/o and lives home alone but has help from neighbors.     7. Dispo: Awaiting PT assessment to help determine dispo.   LOS: 4 days    Brittainy M. Delmer Islam 01/16/2015 7:51 AM  Patient seen and examined  Agree with findings as noted by B SImmons above Nausea improved  Breathing OK  Sl dizzy Will follow  Plan to ambulate with PT.  Dietrich Pates

## 2015-01-16 NOTE — Consult Note (Signed)
Stroke Consult    Chief Complaint: altered mental status and left sided weakness  HPI: Janet Bond is an 81 y.o. female with history of HTN, DM, question of A fib admitted with MI. S/P cardiac cath and revascularization on 4/18. Hospital course uncomplicated until 4/22 when noted difficulty walking and trouble moving her left leg. CT head ordered, imaging reviewed and shows suspected left cerebellar infarcts.   Date last known well: Unable to determine Time last known well: Unable to determine tPA Given: no, outside tPA window  Past Medical History  Diagnosis Date  . Hypertension   . Diabetes mellitus without complication   . Atrial fibrillation     Past Surgical History  Procedure Laterality Date  . Wrist fracture surgery    . Leg surgery    . Left heart catheterization with coronary angiogram N/A 01/12/2015    Procedure: LEFT HEART CATHETERIZATION WITH CORONARY ANGIOGRAM;  Surgeon: Corky CraftsJayadeep S Varanasi, MD;  [facility];  Service: Cardiovascular;  Laterality: N/A;    History reviewed. No pertinent family history. Social History:  reports that she has never smoked. She does not have any smokeless tobacco history on file. She reports that she does not drink alcohol. Her drug history is not on file.  Allergies:  Allergies  Allergen Reactions  . Influenza Vaccines Nausea And Vomiting  . Aspirin Nausea And Vomiting  . Chicken Allergy Nausea And Vomiting  . Codeine Nausea And Vomiting    Medications Prior to Admission  Medication Sig Dispense Refill  . amLODipine (NORVASC) 10 MG tablet Take 10 mg by mouth daily.    . Ascorbic Acid (VITAMIN C PO) Take 1 tablet by mouth daily.    . carvedilol (COREG) 3.125 MG tablet Take 3.125 mg by mouth 2 (two) times daily with a meal.    . losartan (COZAAR) 100 MG tablet Take 100 mg by mouth daily. For HTN    . lovastatin (MEVACOR) 40 MG tablet Take 40 mg by mouth at bedtime. To lower cholesterol    . metFORMIN (GLUCOPHAGE) 500 MG  tablet Take 1,000 mg by mouth 2 (two) times daily with a meal. For diabetes    . methocarbamol (ROBAXIN) 500 MG tablet Take 500 mg by mouth 2 (two) times daily with a meal. For muscle spasms    . traMADol (ULTRAM) 50 MG tablet Take one tablet by mouth three times daily as needed for pain 90 tablet 5    ROS: Out of a complete 14 system review, the patient complains of only the following symptoms, and all other reviewed systems are negative. + gait instability   Physical Examination: Filed Vitals:   01/16/15 1422  BP: 129/70  Pulse: 72  Temp: 98.2 F (36.8 C)  Resp: 18   Physical Exam  Constitutional: He appears well-developed and well-nourished.  Psych: Affect appropriate to situation Eyes: No scleral injection HENT: No OP obstrucion Head: Normocephalic.  Cardiovascular: Normal rate and regular rhythm.  Respiratory: Effort normal and breath sounds normal.  GI: Soft. Bowel sounds are normal. No distension. There is no tenderness.  Skin: WDI  Neurologic Examination: Mental Status: Alert, oriented, thought content appropriate.  Speech fluent without evidence of aphasia.  No dysarthria. Able to follow 3 step commands without difficulty. Cranial Nerves: II: optic discs not visualized, visual fields grossly normal, pupils equal, round, reactive to light and accommodation III,IV, VI: ptosis not present, extra-ocular motions intact bilaterally V,VII: smile symmetric, facial light touch sensation normal bilaterally VIII: hearing normal bilaterally IX,X:  gag reflex present XI: trapezius strength/neck flexion strength normal bilaterally XII: tongue strength normal  Motor: Moves all extremities symmetrically and against light resistance Tone and bulk:normal tone throughout; no atrophy noted Sensory: Pinprick and light touch intact throughout, bilaterally Deep Tendon Reflexes: 2+ and symmetric throughout Plantars: Right: downgoing   Left: downgoing Cerebellar: normal  finger-to-nose, and normal heel-to-shin test Gait: deferred  Laboratory Studies:   Basic Metabolic Panel:  Recent Labs Lab 01/12/15 2139 01/13/15 0430 01/14/15 1042 01/15/15 0556 01/16/15 0628  NA 133* 134* 128* 130* 130*  K 3.2* 3.2* 3.9 4.6 3.4*  CL 98 102 97 99 94*  CO2 GLUCOSE 158* 92 162* 134* 103*  BUN CREATININE 0.73 0.55 0.91 0.98 1.09  CALCIUM 8.9 8.6 9.1 9.0 8.8    Liver Function Tests: No results for input(s): AST, ALT, ALKPHOS, BILITOT, PROT, ALBUMIN in the last 168 hours. No results for input(s): LIPASE, AMYLASE in the last 168 hours. No results for input(s): AMMONIA in the last 168 hours.  CBC:  Recent Labs Lab 01/12/15 2139 01/13/15 0430 01/14/15 0253 01/15/15 0556  WBC 5.2 5.0 5.7 4.5  NEUTROABS 2.9  --   --   --   HGB 11.0* 9.2* 9.7* 10.2*  HCT 34.7* 28.5* 30.9* 32.7*  MCV 82.0 81.9 83.1 83.6  PLT 191 181 187 182    Cardiac Enzymes:  Recent Labs Lab 01/13/15 0215 01/13/15 0430 01/13/15 1125 01/13/15 1600 01/13/15 2116  CKTOTAL 956* 967*  --   --   --   CKMB 134.9* 137.3*  --   --   --   TROPONINI 56.23*  --  48.81* 46.10* 30.99*    BNP: Invalid input(s): POCBNP  CBG:  Recent Labs Lab 01/15/15 0744 01/15/15 1227 01/15/15 1705 01/15/15 2204 01/16/15 1210  GLUCAP 146* 123* 130* 113* 115*    Microbiology: Results for orders placed or performed during the hospital encounter of 01/12/15  MRSA PCR Screening     Status: None   Collection Time: 01/13/15 12:16 AM  Result Value Ref Range Status   MRSA by PCR NEGATIVE NEGATIVE Final    Comment:        The GeneXpert MRSA Assay (FDA approved for NASAL specimens only), is one component of a comprehensive MRSA colonization surveillance program. It is not intended to diagnose MRSA infection nor to guide or monitor treatment for MRSA infections.     Coagulation Studies: No results for input(s): LABPROT, INR in the last 72 hours.  Urinalysis:  No results for input(s): COLORURINE, LABSPEC, PHURINE, GLUCOSEU, HGBUR, BILIRUBINUR, KETONESUR, PROTEINUR, UROBILINOGEN, NITRITE, LEUKOCYTESUR in the last 168 hours.  Invalid input(s): APPERANCEUR  Lipid Panel:  No results found for: CHOL, TRIG, HDL, CHOLHDL, VLDL, LDLCALC  HgbA1C: No results found for: HGBA1C  Urine Drug Screen:  No results found for: LABOPIA, COCAINSCRNUR, LABBENZ, AMPHETMU, THCU, LABBARB  Alcohol Level: No results for input(s): ETH in the last 168 hours.   Imaging: Ct Head Wo Contrast  01/16/2015   CLINICAL DATA:  New onset of LEFT lower extremity ataxia and slurred speech. Patient is status post cardiac catheterization with revascularization of coronary arteries. Initial encounter.  EXAM: CT HEAD WITHOUT CONTRAST  TECHNIQUE: Contiguous axial images were obtained from the base of the skull through the vertex without intravenous contrast.  COMPARISON:  None.  FINDINGS: Normal for age cerebral volume. Hypoattenuation of white matter representing small vessel disease. Remote RIGHT paramedian pontine lacunar infarct.  An asymmetric area of slightly greater than 1 cm size hypoattenuation LEFT cerebellum seen best on image 8 series 2 could result in LEFT-sided dystaxia. In addition, there is an asymmetric similar size area of hypoattenuation LEFT superior cerebellum. The attenuation of these lesions suggest they could be acute or subacute LEFT cerebellar infarcts. CTA head neck and/or for MRI brain without and with contrast with MRA intracranial recommended for further evaluation.  Calvarium intact. No sinus or mastoid disease. Moderate vascular calcification in the carotid siphons. Negative orbits.  IMPRESSION: Suspected acute LEFT cerebellar infarcts. See discussion above; additional imaging may be indicated.  These results will be called to the ordering clinician or representative by the Radiologist Assistant, and communication documented in the PACS or zVision Dashboard.    Electronically Signed   By: Davonna Belling M.D.   On: 01/16/2015 15:45    Assessment: 81 y.o. female hx of HTN, DM, history of A fib presented with MI s/p cardiac cath on 4/18. Developed acute onset gait instability on 4/22. CT head shows suspected left cerebellar infarct. Will proceed with stroke workup.   Plan: 1. HgbA1c, fasting lipid panel 2. MRI, MRA  of the brain without contrast 3. PT consult, OT consult, Speech consult 4. Echocardiogram completed 4/19 5. Carotid dopplers 6. Prophylactic therapy-continue ASA  and Plavix  daily 7. Risk factor modification 8. Telemetry monitoring 9. Frequent neuro checks    Elspeth Cho, DO [facility] [phone]  If 7pm- 7am, please page neurology on call as listed in AMION. 01/16/2015, 5:35 PM

## 2015-01-16 NOTE — Progress Notes (Signed)
Notified Boyce MediciBrittany Simmons, [place] of positive CT scan. New orders to follow. Emelda Brothershristy Kolston Lacount RN

## 2015-01-16 NOTE — Progress Notes (Signed)
Pt granddaughter expressed to PT via phone that her grandmother's voice sounded different on the phone and therapy stated she had left leg weakness. Pt stated she has been having numbness in her hands for a month and has reported it to her primary MD. Sherron MondaySpoke with Boyce MediciBrittany Simmons PA about findings. She wanted me to have the rapid response nurse come assess her to see if she see's any deficits. Dyke Brackettebbie Britt RN assessed and noticed dysarthria as well. Notified [place]adaBrittany of findings, asked me to call Dr. Tenny Crawoss. Dr. Tenny Crawoss notified with new orders given. Emelda Brothershristy Jalisa Sacco RN

## 2015-01-16 NOTE — Progress Notes (Signed)
CARDIAC REHAB PHASE I   PRE:  Rate/Rhythm: 81 SR    BP: sitting 114/51 bed, 130/63 standing    SaO2: 97 RA  MODE:  Ambulation: around bed to recliner   POST:  Rate/Rhythm: 90 SR    BP: sitting 124/57     SaO2: 96 RA  Pt able to get to EOB by herself although slow. However once standing pt with significant posterior lean, popping RW up on back legs. She could not correct this herself and stated she couldn't pick up her feet. Sat back on EOB for 5 min and asked her to lift her legs which she could do. Stood again with gait belt and RW, assist x2. Pt still with posterior lean but was able to take small steps forward. Left leg weaker than right. She sts she broke her left pelvis 1 year ago and this is prob the reason she struggled with moving left leg forward. She was able to slowly walk around foot of bed to recliner however she sat too early and we had to pull her to the seat with gait belt so not to sit in floor. Pt was able to walk with me 1/2 way around unit 2 days ago. Feel pt needs to stay today for therapy, not ready for d/c. On chair alarm in recliner. PT to see today. [phone]  [name], Tyshika Baldridge RobinwoodKristan CES, ACSM 01/16/2015 10:09 AM

## 2015-01-16 NOTE — Progress Notes (Addendum)
Called by RN Neysa Bonitohristy per request of [place] PA to see patient for possbile neuro changes.  Patient sitting in chair on my arrival.  Alert and oriented.  Warm and dry.  Follows commands without problem.  No weakness noted with strength - no drift either side - PT and cardiac rehab staff notes reviewed which note that today she has balance and gait issues and left leg "delay" with walking which was not noted 2 days ago. Denies headache.  Patient has slurred speech - she states it feels a little different - oral mucosa dry- moistened - no changed in dysarthria. Granddaughter on phone with patient - feels speech is very different.   No aphasia.  No other focal deficits noted.  Gait and balance not checked by this RN - will defer to MD.atient reports hands feel numb bilaterally - she has felt this for several weeks and reported it to her PCP.     BP 117/58 HR 74 SR - CBG 115.  Noted higher BP in 150-180 systolic on admission.  Discussed with Neysa BonitoChristy RN  - she will report findings to PA [place]ittany.  Question small vessel disease and symptoms with decreased BP - has good LV function - vs new stroke sx.  Call as needed.

## 2015-01-16 NOTE — Evaluation (Signed)
Physical Therapy Evaluation Patient Details Name: Janet Bond MRN: [id number] DOB: 11/17/1922 Today's Date: 01/16/2015   History of Present Illness  Pt admitted 04/18 with acute inferior STEMI.  Pt underwent aspiration thrombectomy of the posterolateral artery (Right PAV), PCI of the posterolateral branch. PMH - DM, HTN, pelvic fx  Clinical Impression  Pt presents to PT with decr mobility due to weakness and decr balance. Pt with some difficulty advancing LLE with gait. To manual muscle test left leg without marked weakness but functionally weakness noted. Pt's [name] on phone with pt and she reports she can tell her grandmother is different today. Nursing aware of this. Granddaughter also reports family has been trying to encourage pt to go into some kind of retirement living for a couple of years. Pt does not have 24 hour assist available at home and recommend ST-SNF.    Follow Up Recommendations SNF    Equipment Recommendations  None recommended by PT    Recommendations for Other Services       Precautions / Restrictions Precautions Precautions: Fall      Mobility  Bed Mobility               General bed mobility comments: Up in chair.  Transfers Overall transfer level: Needs assistance Equipment used: Rolling walker (2 wheeled) Transfers: Sit to/from Stand Sit to Stand: Mod assist         General transfer comment: Assist to bring hips up and for balance due to posterior lean.  Ambulation/Gait Ambulation/Gait assistance: Min assist Ambulation Distance (Feet): 10 Feet Assistive device: Rolling walker (2 wheeled) Gait Pattern/deviations: Step-through pattern;Decreased step length - left;Decreased dorsiflexion - left     General Gait Details: Pt with posterior lean initially. Pt with difficulty advancing LLE and left knee and hip began flexing in stance after very short distance and pt had to sit down.  Stairs            Wheelchair Mobility     Modified Rankin (Stroke Patients Only)       Balance Overall balance assessment: Needs assistance Sitting-balance support: No upper extremity supported;Feet supported Sitting balance-Leahy Scale: Good     Standing balance support: Bilateral upper extremity supported Standing balance-Leahy Scale: Poor Standing balance comment: support of walker and min A. Posterior lean                             Pertinent Vitals/Pain Pain Assessment: No/denies pain    Home Living Family/patient expects to be discharged to:: Private residence Living Arrangements: Alone Available Help at Discharge: Family;Available PRN/intermittently Type of Home: House Home Access: Stairs to enter Entrance Stairs-Rails: None Entrance Stairs-Number of Steps: 1 Home Layout: One level Home Equipment: Walker - 2 wheels      Prior Function Level of Independence: Independent         Comments: Reports she has not been using rolling walker lately.     Hand Dominance        Extremity/Trunk Assessment   Upper Extremity Assessment: Generalized weakness           Lower Extremity Assessment: Generalized weakness;LLE deficits/detail   LLE Deficits / Details: In sitting strenght test 4+/5. However functionally pt with difficulty advancing LLE during gait and hip and knee begin to flex in stance after walking very short distance.     Communication   Communication: HOH  Cognition Arousal/Alertness: Awake/alert Behavior During Therapy: WFL for tasks assessed/performed Overall  Cognitive Status: Impaired/Different from baseline       Memory: Decreased short-term memory         General Comments: [name] on phone reports pt doesn't sound like she usually does. She also reports pt has been having some cognitive decline at home over the past 1-2 years (not eating, forgetting to take meds).    General Comments      Exercises        Assessment/Plan    PT Assessment  Patient needs continued PT services  PT Diagnosis Difficulty walking;Abnormality of gait;Generalized weakness   PT Problem List Decreased strength;Decreased activity tolerance;Decreased balance;Decreased mobility;Decreased knowledge of use of DME  PT Treatment Interventions DME instruction;Balance training;Gait training;Neuromuscular re-education;Functional mobility training;Patient/family education;Therapeutic activities;Therapeutic exercise   PT Goals (Current goals can be found in the Care Plan section) Acute Rehab PT Goals Patient Stated Goal: return home PT Goal Formulation: With patient Time For Goal Achievement: 01/30/15 Potential to Achieve Goals: Good    Frequency Min 3X/week   Barriers to discharge Decreased caregiver support      Co-evaluation               End of Session Equipment Utilized During Treatment: Gait belt Activity Tolerance: Patient limited by fatigue Patient left: in chair;with call bell/phone within reach;with chair alarm set Nurse Communication: Mobility status;Other (comment) (decr function of LLE)         Time: 1610-9604 PT Time Calculation (min) (ACUTE ONLY): 19 min   Charges:   PT Evaluation $Initial PT Evaluation Tier I: 1 Procedure     PT G Codes:        Janet Bond 02/08/2015, 12:00 PM  Janet Bond PT [phone]

## 2015-01-17 LAB — BASIC METABOLIC PANEL
Anion gap: 7 (ref 5–15)
BUN: 24 mg/dL — AB (ref 6–23)
CALCIUM: 8.7 mg/dL (ref 8.4–10.5)
CO2: 28 mmol/L (ref 19–32)
Chloride: 94 mmol/L — ABNORMAL LOW (ref 96–112)
Creatinine, Ser: 1.27 mg/dL — ABNORMAL HIGH (ref 0.50–1.10)
GFR, EST AFRICAN AMERICAN: 42 mL/min — AB (ref >90)
GFR, EST NON AFRICAN AMERICAN: 36 mL/min — AB (ref >90)
GLUCOSE: 87 mg/dL (ref 70–99)
POTASSIUM: 4 mmol/L (ref 3.5–5.1)
Sodium: 129 mmol/L — ABNORMAL LOW (ref 135–145)

## 2015-01-17 LAB — GLUCOSE, CAPILLARY
GLUCOSE-CAPILLARY: 103 mg/dL — AB (ref 70–99)
Glucose-Capillary: 100 mg/dL — ABNORMAL HIGH (ref 70–99)
Glucose-Capillary: 101 mg/dL — ABNORMAL HIGH (ref 70–99)
Glucose-Capillary: 110 mg/dL — ABNORMAL HIGH (ref 70–99)
Glucose-Capillary: 62 mg/dL — ABNORMAL LOW (ref 70–99)
Glucose-Capillary: 89 mg/dL (ref 70–99)

## 2015-01-17 MED ORDER — METHOCARBAMOL 500 MG PO TABS
500.0000 mg | ORAL_TABLET | Freq: Two times a day (BID) | ORAL | Status: DC
  Administered 2015-01-18 – 2015-01-19 (×4): 500 mg via ORAL
  Filled 2015-01-17 (×4): qty 1

## 2015-01-17 MED ORDER — TRAMADOL HCL 50 MG PO TABS
50.0000 mg | ORAL_TABLET | Freq: Four times a day (QID) | ORAL | Status: DC | PRN
  Administered 2015-01-19: 50 mg via ORAL
  Filled 2015-01-17: qty 1

## 2015-01-17 MED ORDER — POLYETHYLENE GLYCOL 3350 17 G PO PACK
17.0000 g | PACK | Freq: Every day | ORAL | Status: DC | PRN
  Administered 2015-01-18: 17 g via ORAL
  Filled 2015-01-17: qty 1

## 2015-01-17 NOTE — Clinical Social Work Placement (Signed)
   CLINICAL SOCIAL WORK PLACEMENT  NOTE  Date:  01/17/2015  Patient Details  Name: Janet Bond MRN: [id number] Date of Birth: 07/20/1923  Clinical Social Work is seeking post-discharge placement for this patient at the Skilled  Nursing Facility level of care (*CSW will initial, date and re-position this form in  chart as items are completed):  Yes   Patient/family provided with [facility] Clinical Social Work Department's list of facilities offering this level of care within the geographic area requested by the patient (or if unable, by the patient's family).  Yes   Patient/family informed of their freedom to choose among providers that offer the needed level of care, that participate in Medicare, Medicaid or managed care program needed by the patient, have an available bed and are willing to accept the patient.  Yes   Patient/family informed of [facility]'s ownership interest in [facility] and [facility], as well as of the fact that they are under no obligation to receive care at these facilities.  PASRR submitted to EDS on       PASRR number received on       Existing PASRR number confirmed on       FL2 transmitted to all facilities in geographic area requested by pt/family on       FL2 transmitted to all facilities within larger geographic area on       Patient informed that his/her managed care company has contracts with or will negotiate with certain facilities, including the following:            Patient/family informed of bed offers received.  Patient chooses bed at       Physician recommends and patient chooses bed at      Patient to be transferred to   on  .  Patient to be transferred to facility by       Patient family notified on   of transfer.  Name of family member notified:        PHYSICIAN       Additional Comment:    _______________________________________________ [name], MSW, LCSWA [phone] 01/17/2015 3:57 PM

## 2015-01-17 NOTE — Clinical Social Work Note (Signed)
Clinical Social Work Assessment  Patient Details  Name: Janet Bond MRN: [id number] Date of Birth: 05/29/1923  Date of referral:  01/17/15               Reason for consult:  Facility Placement, Discharge Planning                Permission sought to share information with:  Family Supports, Magazine features editor, Case Manager  Housing/Transportation Living arrangements for the past 2 months:  Single Family Home Source of Information:  Adult Children Medical laboratory scientific officer) Patient Interpreter Needed:  None Criminal Activity/Legal Involvement Pertinent to Current Situation/Hospitalization:  No - Comment as needed Significant Relationships:  Adult Children Lives with:  Adult Children Do you feel safe going back to the place where you live?  Yes Need for family participation in patient care:  Yes (Comment)  Care giving concerns:  Pt's granddaughter reported she is agreeable to short-term SNF placement however believes pt will require long-term SNF placement which has not been discussed with pt at this time.    Social Worker assessment / plan:  Visual merchandiser spoke with pt's granddtr, Janet Bond at length in reference to post-acute placement for SNF. CSW introduced CSW role and SNF process. Pt's granddtr reported pt has been a resident at [facility] in the past and will like for pt to return if possible. Pt's granddtr further requested if CSW could look into long-term placement at [facility] which is closer to her home. Pt's granddtr expressed she does not believe pt will be able to return home due to medical and physical condition. Pt's granddtr reported she is overwhelmed and that it has been challenging receiving medical updates from staff because she is on bed rest. Pt's granddtr reported she had surgery last week and was released from [facility] last Friday, 4/15. Pt's granddtr also indicated she and family have looked into long-term placement in the past and attempted to  complete a Medicaid application however pt's assets became a barrier to placement. Pt's granddtr stated pt's family is very supportive and involved in pt's care. Pt's other granddtr, Janet Bond will be arriving from [place], [place] soon to assist with decision making. No further concerns reported by pt's granddtr. Unit CSW to provide updates to pt's family. CSW will continue to follow pt and pt's family for continued support and to facilitate pt's discharge needs once medically stable.   Employment status:  Retired Database administrator PT Recommendations:  24 Hour Supervision, Skilled Nursing Facility Information / Referral to community resources:  Skilled Nursing Facility  Patient/Family's Response to care: Pt's granddtr reported she is agreeable to SNF placement and indicated family is agreeable as well. Would like placement at [facility] as pt has been a resident of facility in the past and facility is fairly close to her home. Pt's granddtr stated pt is not aware of family considering long-term placement as she would not agree with the idea. Pt's granddtr pleasant and appreciated social work intervention.   Patient/Family's Understanding of and Emotional Response to Diagnosis, Current Treatment, and Prognosis:  Pt's granddtr asked several questions in reference to pt's condition and reported she learned yesterday that pt had a stroke. Granddtr stated she is overwhelmed because she cannot visit pt due to being in bed rest. Pt's granddtr stated she is stressed and feels helpless due to her own limitations and health. She stated her sister would be arriving from out of town  to assist with pt which makes her feel a little better.   Emotional Assessment Appearance:  Appears stated age Attitude/Demeanor/Rapport:  Other (Confused ) Affect (typically observed):  Unable to Assess Orientation:  Oriented to Self Alcohol / Substance use:  Never Used Psych involvement  (Current and /or in the community):  No (Comment)  Discharge Needs  Concerns to be addressed:  Basic Needs Readmission within the last 30 days:  No Current discharge risk:  None Barriers to Discharge:  No Barriers Identified   Janet FennelBashira Season Bond, MSW, LCSWA [phone] 01/17/2015 3:54 PM

## 2015-01-17 NOTE — Evaluation (Signed)
Speech Language Pathology Evaluation Patient Details Name: Janet Bond MRN: [id number] DOB: 05/04/1923 Today's Date: 01/17/2015 Time: [id number] SLP Time Calculation (min) (ACUTE ONLY): 20 min  Problem List:  Patient Active Problem List   Diagnosis Date Noted  . Cerebellar stroke 01/17/2015  . CAD (coronary artery disease), native coronary artery 01/17/2015  . Heart murmur 01/13/2015  . Dyslipidemia, goal LDL below 70 01/13/2015  . Acute myocardial infarction of inferior wall   . Essential hypertension   . Type 2 diabetes mellitus with other circulatory complications   . Atrial fibrillation    Past Medical History:  Past Medical History  Diagnosis Date  . Hypertension   . Diabetes mellitus without complication   . Atrial fibrillation    Past Surgical History:  Past Surgical History  Procedure Laterality Date  . Wrist fracture surgery    . Leg surgery    . Left heart catheterization with coronary angiogram N/A 01/12/2015    Procedure: LEFT HEART CATHETERIZATION WITH CORONARY ANGIOGRAM;  Surgeon: Corky CraftsJayadeep S Varanasi, MD;  [facility];  Service: Cardiovascular;  Laterality: N/A;   HPI:  Pt admitted 04/18 with acute inferior STEMI. Pt underwent aspiration thrombectomy of the posterolateral artery (Right PAV), PCI of the posterolateral branch. On 4/22 acute neurological changes noted, and MRI revealed acute infarcts in left cerebellum, right occipital pole, and posterior to the irght caudate head.  PMH - DM, HTN, pelvic fx   Assessment / Plan / Recommendation Clinical Impression  Pt has a moderate dysarthria that decreases intelligibility of speech at the conversational level. Her current level of alertness is not sufficient for adequate participation in cognitive-linguistic tasks, as she has fleeting periods of sustained attention with resultant difficulties storing new information, following instructions, and problem solving. Per discussed with RN, lethargy could possibly  be related to medications provided. Will continue to follow to address clarity of speech for functional communication. Will provide additional cognitive treatment focused on diffierential diagnosis of abilities as pt is more alert.    SLP Assessment  Patient needs continued Speech Lanaguage Pathology Services    Follow Up Recommendations  Skilled Nursing facility;24 hour supervision/assistance    Frequency and Duration min 2x/week  2 weeks   Pertinent Vitals/Pain Pain Assessment: No/denies pain   SLP Goals  Patient/Family Stated Goal: none stated Potential to Achieve Goals (ACUTE ONLY): Fair Potential Considerations (ACUTE ONLY): Previous level of function  SLP Evaluation Prior Functioning  Cognitive/Linguistic Baseline: Baseline deficits Baseline deficit details: Per PT note, family described decline in cognition including forgetting to take medications Type of Home: House   Cognition  Overall Cognitive Status: Difficult to assess (drowsy, no family present) Arousal/Alertness: Suspect due to medications Orientation Level: Oriented to person;Oriented to place;Disoriented to time;Disoriented to situation Attention: Sustained;Focused Focused Attention: Appears intact Sustained Attention: Impaired Sustained Attention Impairment: Verbal basic;Functional basic Memory: Impaired Memory Impairment: Storage deficit;Retrieval deficit;Decreased recall of new information Awareness: Impaired Awareness Impairment: Emergent impairment;Anticipatory impairment Problem Solving: Impaired Problem Solving Impairment: Verbal basic;Functional basic Comments: level of alertness impacts functional participation in all other cognitive-linguistic tasks    Comprehension       Expression     Oral / Motor Motor Speech Overall Motor Speech: Impaired Phonation: Low vocal intensity Resonance: Within functional limits Articulation: Impaired Level of Impairment: Conversation Intelligibility:  Intelligibility reduced Conversation: 75-100% accurate   Janet Bond, Janet Bond [phone]  Janet Bond, Janet Bond 01/17/2015, 11:32 AM

## 2015-01-17 NOTE — Plan of Care (Signed)
Problem: Progression Outcomes Goal: Progressive activity as tolerated Outcome: Completed/Met Date Met:  01/17/15 Pt OOB up in chair x2 today ambulates with assist x1 and walker has difficulty moving left leg. Pt very drowsy today falling asleep while eating and during visitors conversation. More alert this evening after holding Robaxin

## 2015-01-17 NOTE — Progress Notes (Signed)
Subjective:  Place of numbness of her left leg and some difficulty with movement of her left leg and.  No complaints of chest pain or nausea.  No shortness of breath.  Objective:  Vital Signs in the last 24 hours: BP 136/60 mmHg  Pulse 84  Temp(Src) 98.7 F (37.1 C) (Oral)  Resp 18  Ht 5\' 6"  (1.676 m)  Wt 57.516 kg (126 lb 12.8 oz)  BMI 20.48 kg/m2  SpO2 98%  Physical Exam: Pleasant elderly black female sitting up in the chair at the side of the bed Lungs:  Clear Cardiac:  Regular rhythm, normal S1 and S2, no S3, 1 to 2/6 systolic murmur across the aortic valve  Abdomen:  Soft, nontender, no masses Extremities:  No edema present Neuro: Mild dysarthria.  Intake/Output from previous day: 04/22 0701 - 04/23 0700 In: 790 [P.O.:790] Out: 300 [Urine:300]  Weight Filed Weights   01/15/15 0546 01/16/15 0334 01/17/15 0518  Weight: 60.374 kg (133 lb 1.6 oz) 56.609 kg (124 lb 12.8 oz) 57.516 kg (126 lb 12.8 oz)    Lab Results: Basic Metabolic Panel:  Recent Labs  16/06/9603/22/16 0628 01/17/15 0320  NA 130* 129*  K 3.4* 4.0  CL 94* 94*  CO2 29 28  GLUCOSE 103* 87  BUN 15 24*  CREATININE 1.09 1.27*   CBC:  Recent Labs  01/15/15 0556  WBC 4.5  HGB 10.2*  HCT 32.7*  MCV 83.6  PLT 182   Telemetry: Currently sinus rhythm   Assessment/Plan:  1.  Interval cerebellar stroke probably occurring yesterday by notes and clinical scenario 2.  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  Mild aortic stenosis 4.  Hypertension currently controlled  Recommendations:  Appears to have had a stroke.  Occurring yesterday likely.  Neurology is currently following.  She had good LV function and is already on antiplatelet therapy.  We'll need to have PT and OT see.      Darden PalmerW. Spencer Nicolaos Mitrano, Jr.  MD [facility] Card[facility]  01/17/2015, 10:47 AM

## 2015-01-17 NOTE — Progress Notes (Signed)
STROKE TEAM PROGRESS NOTE   HISTORY Janet Bond is an 81 y.o. female with history of HTN, DM, question of A fib admitted with MI. S/P cardiac cath and revascularization on 4/18. Hospital course uncomplicated until 4/22 when noted difficulty walking and trouble moving her left leg. CT head ordered, imaging reviewed and shows suspected left cerebellar infarcts.   Date last known well: Unable to determine Time last known well: Unable to determine tPA Given: no, outside tPA window     SUBJECTIVE (INTERVAL HISTORY) Patient is a poor historian. But says her leg is still weak. No family at bedside.   OBJECTIVE Temp:  [97.9 F (36.6 C)-98.7 F (37.1 C)] 98.7 F (37.1 C) (04/23 0811) Pulse Rate:  [63-109] 84 (04/23 0939) Cardiac Rhythm:  [-] Normal sinus rhythm (04/22 2000) Resp:  [11-22] 18 (04/23 0811) BP: (100-137)/(44-70) 136/60 mmHg (04/23 0939) SpO2:  [94 %-100 %] 98 % (04/23 0811) Weight:  [57.516 kg (126 lb 12.8 oz)] 57.516 kg (126 lb 12.8 oz) (04/23 0518)   Recent Labs Lab 01/16/15 1914 01/16/15 2159 01/16/15 2359 01/17/15 0211 01/17/15 0748  GLUCAP 158* 124* 62* 89 101*    Recent Labs Lab 01/13/15 0430 01/14/15 1042 01/15/15 0556 01/16/15 0628 01/17/15 0320  NA 134* 128* 130* 130* 129*  K 3.2* 3.9 4.6 3.4* 4.0  CL 102 97 99 94* 94*  CO2 GLUCOSE 92 162* 134* 103* 87  BUN 24*  CREATININE 0.55 0.91 0.98 1.09 1.27*  CALCIUM 8.6 9.1 9.0 8.8 8.7   No results for input(s): AST, ALT, ALKPHOS, BILITOT, PROT, ALBUMIN in the last 168 hours.  Recent Labs Lab 01/12/15 2139 01/13/15 0430 01/14/15 0253 01/15/15 0556  WBC 5.2 5.0 5.7 4.5  NEUTROABS 2.9  --   --   --   HGB 11.0* 9.2* 9.7* 10.2*  HCT 34.7* 28.5* 30.9* 32.7*  MCV 82.0 81.9 83.1 83.6  PLT 191 181 187 182    Recent Labs Lab 01/13/15 0215 01/13/15 0430 01/13/15 1125 01/13/15 1600 01/13/15 2116  CKTOTAL 956* 967*  --   --   --   CKMB 134.9* 137.3*  --   --   --    TROPONINI 56.23*  --  48.81* 46.10* 30.99*   No results for input(s): LABPROT, INR in the last 72 hours. No results for input(s): COLORURINE, LABSPEC, PHURINE, GLUCOSEU, HGBUR, BILIRUBINUR, KETONESUR, PROTEINUR, UROBILINOGEN, NITRITE, LEUKOCYTESUR in the last 72 hours.  Invalid input(s): APPERANCEUR  No results found for: CHOL, TRIG, HDL, CHOLHDL, VLDL, LDLCALC No results found for: HGBA1C No results found for: LABOPIA, COCAINSCRNUR, LABBENZ, AMPHETMU, THCU, LABBARB  No results for input(s): ETH in the last 168 hours.  Ct Head Wo Contrast 01/16/2015    Suspected acute LEFT cerebellar infarcts. See discussion above; additional imaging may be indicated.      MRI / MRA Brain Wo Contrast 01/16/2015    1. Left superior cerebellar territory acute nonhemorrhagic infarcts are confirmed.  2. Punctate acute nonhemorrhagic infarct in the right occipital pole.  3. Punctate acute nonhemorrhagic infarct posterior to the right caudate head.  4. Acute nonhemorrhagic 7 mm infarct within the posterior left frontal lobe coronal radiata.  5. Moderate atrophy and advanced confluent periventricular white matter disease bilaterally likely reflects the sequela of chronic microvascular ischemia.  6. Remote nonhemorrhagic right paramedian pontine infarct.  7. 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  8. Mild distal small vessel disease  is present without other significant proximal stenosis, aneurysm, or branch vessel occlusion.     PHYSICAL EXAM  PHYSICAL EXAM Physical exam: Exam: Gen: NAD Eyes: anicteric sclerae, moist conjunctivae                    CV: no MRG, no carotid bruits, no peripheral edema Mental Status: Alert, poor historian, follows commands  Speech:    No aphasia, no dysarthria  Cranial Nerves:    The pupils are equal, round, and reactive to light.. Attempted, Fundi not visualized.  EOMI. No gaze preference. Visual fields full. Face symmetric, Tongue  midline. Hearing normal. Gag intact.   Motor Observation:    no involuntary movements noted. Tone appears normal.     Strength:    Left lower extremity weakness, 3+/5.     Sensation:  Intact to LT  Plantars downgoing.   Cerebellar: no dysmetria     ASSESSMENT/PLAN [name] is a 81 y.o. female admitted with an acute inferior MI treated with PTCA, hypertension, diabetes mellitus, and question of atrial fibrillation, who developed left lower extremity weakness, gait disturbance, and dysarthria on 01/16/2015.   She did not receive IV t-PA due to being outside the TPA window.  Stroke:  Multiple bilateral infarcts as noted above probably embolic secondary to atrial fibrillation.    MRI  - as above   MRA  - as above   Carotid Doppler - pending  2D Echo - EF 60-65%. No cardiac source of emboli identified.  LDL pending   HgbA1c pending  Subcutaneous heparin for VTE prophylaxis  Diet heart healthy/carb modified Room service appropriate?: Yes; Fluid consistency:: Thin  no antithrombotic prior to admission, now on aspirin 81 mg orally every day and Plavix  Ongoing aggressive stroke risk factor management  Therapy recommendations: Pending  Disposition: Pending  Hypertension  Home meds: Norvasc, Coreg, and Cozaar   Blood pressure mildly low at times 118/52 this a.m.- currently back on home meds for hypertension.   Hyperlipidemia  Home meds: Mevacor 40 mg daily - now on Lipitor 40 mg daily.  LDL pending, goal < 70  Continue statin at discharge  Diabetes  HgbA1c pending, goal < 7.0  Controlled  Other Stroke Risk Factors  Advanced age  Hx stroke/TIA  Coronary artery disease  History of atrial fibrillation   Other Active Problems  Sodium 129   Renal insufficiency - BUN 24 / creatinine 1.27  Anemia 10.2 / 32.7  Recent MI  Other Pertinent History    Hospital day # 5   Personally examined patient and images, and have documented history,  physical, neuro exam,assessment and plan as stated above.   Janet DeanAntonia Sahib Pella, MD Stroke Neurology [phone] [facility]           To contact Stroke Continuity provider, please refer to [email or web address]. After hours, contact General Neurology

## 2015-01-18 LAB — GLUCOSE, CAPILLARY
GLUCOSE-CAPILLARY: 118 mg/dL — AB (ref 70–99)
Glucose-Capillary: 91 mg/dL (ref 70–99)
Glucose-Capillary: 107 mg/dL — ABNORMAL HIGH (ref 70–99)
Glucose-Capillary: 133 mg/dL — ABNORMAL HIGH (ref 70–99)

## 2015-01-18 LAB — LIPID PANEL
CHOL/HDL RATIO: 1.7 ratio
Cholesterol: 138 mg/dL (ref 0–200)
HDL: 82 mg/dL (ref >39)
LDL Cholesterol: 45 mg/dL (ref 0–99)
Triglycerides: 54 mg/dL (ref <150)
VLDL: 11 mg/dL (ref 0–40)

## 2015-01-18 LAB — OCCULT BLOOD X 1 CARD TO LAB, STOOL: FECAL OCCULT BLD: POSITIVE — AB

## 2015-01-18 NOTE — Progress Notes (Signed)
Subjective:  Still complains of numbness of her left leg and some difficulty with movement of her left leg and mild difficulty with speech..  No complaints of chest pain or nausea.  No shortness of breath.  Objective:  Vital Signs in the last 24 hours: BP 125/52 mmHg  Pulse 78  Temp(Src) 98.3 F (36.8 C) (Axillary)  Resp 15  Ht 5\' 6"  (1.676 m)  Wt 56.7 kg (125 lb)  BMI 20.19 kg/m2  SpO2 94%  Physical Exam: Pleasant elderly black female lying in bed in no acute distress Lungs:  Clear Cardiac:  Regular rhythm, normal S1 and S2, no S3, 1 to 2/6 systolic murmur across the aortic valve  Abdomen:  Soft, nontender, no masses Extremities:  No edema present Neuro: Mild dysarthria.  Intake/Output from previous day: 04/23 0701 - 04/24 0700 In: 900 [P.O.:900] Out: 850 [Urine:850]  Weight Filed Weights   01/16/15 0334 01/17/15 0518 01/18/15 0418  Weight: 56.609 kg (124 lb 12.8 oz) 57.516 kg (126 lb 12.8 oz) 56.7 kg (125 lb)    Lab Results: Basic Metabolic Panel:  Recent Labs  40/98/1104/22/16 0628 01/17/15 0320  NA 130* 129*  K 3.4* 4.0  CL 94* 94*  CO2 29 28  GLUCOSE 103* 87  BUN 15 24*  CREATININE 1.09 1.27*   Telemetry: Currently sinus rhythm   Assessment/Plan:  1.  Interval cerebellar stroke question if it was due to atrial fibrillation or could've occurred embolic after catheterization 2.  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  Mild aortic stenosis 4.  Hypertension currently controlled 5.  Mild acute renal failure-will watch hydration. 6.  Hyponatremia  Recommendations:  Cardiac status appears stable.  Mild increase in creatinine is noted.  Will check I&O's and follow-up.  Could be contrast-related.  Continue stroke therapy.  Darden PalmerW. Spencer Shivonne Schwartzman, Jr.  MD [facility] Cardiology  01/18/2015, 8:17 AM

## 2015-01-18 NOTE — Progress Notes (Addendum)
STROKE TEAM PROGRESS NOTE   HISTORY Janet Bond is an 81 y.o. female with history of HTN, DM, question of A fib admitted with MI. S/P cardiac cath and revascularization on 4/18. Hospital course uncomplicated until 4/22 when noted difficulty walking and trouble moving her left leg. CT head ordered, imaging reviewed and shows suspected left cerebellar infarcts.   Date last known well: Unable to determine Time last known well: Unable to determine tPA Given: no, outside tPA window     SUBJECTIVE (INTERVAL HISTORY) Patient is a poor historian. But says her leg is still weak. grandaughter at bedside.   OBJECTIVE Temp:  [98.3 F (36.8 C)-99.3 F (37.4 C)] 99.3 F (37.4 C) (04/24 0820) Pulse Rate:  [72-79] 79 (04/24 1038) Cardiac Rhythm:  [-] Normal sinus rhythm (04/24 0813) Resp:  [13-18] 18 (04/24 0820) BP: (108-133)/(39-62) 121/49 mmHg (04/24 1038) SpO2:  [94 %-100 %] 95 % (04/24 0820) Weight:  [56.7 kg (125 lb)] 56.7 kg (125 lb) (04/24 0418)   Recent Labs Lab 01/17/15 0748 01/17/15 1156 01/17/15 1702 01/17/15 2059 01/18/15 0725  GLUCAP 101* 110* 103* 100* 91    Recent Labs Lab 01/13/15 0430 01/14/15 1042 01/15/15 0556 01/16/15 0628 01/17/15 0320  NA 134* 128* 130* 130* 129*  K 3.2* 3.9 4.6 3.4* 4.0  CL 102 97 99 94* 94*  CO2 GLUCOSE 92 162* 134* 103* 87  BUN 24*  CREATININE 0.55 0.91 0.98 1.09 1.27*  CALCIUM 8.6 9.1 9.0 8.8 8.7   No results for input(s): AST, ALT, ALKPHOS, BILITOT, PROT, ALBUMIN in the last 168 hours.  Recent Labs Lab 01/12/15 2139 01/13/15 0430 01/14/15 0253 01/15/15 0556  WBC 5.2 5.0 5.7 4.5  NEUTROABS 2.9  --   --   --   HGB 11.0* 9.2* 9.7* 10.2*  HCT 34.7* 28.5* 30.9* 32.7*  MCV 82.0 81.9 83.1 83.6  PLT 191 181 187 182    Recent Labs Lab 01/13/15 0215 01/13/15 0430 01/13/15 1125 01/13/15 1600 01/13/15 2116  CKTOTAL 956* 967*  --   --   --   CKMB 134.9* 137.3*  --   --   --   TROPONINI 56.23*  --   48.81* 46.10* 30.99*   No results for input(s): LABPROT, INR in the last 72 hours. No results for input(s): COLORURINE, LABSPEC, PHURINE, GLUCOSEU, HGBUR, BILIRUBINUR, KETONESUR, PROTEINUR, UROBILINOGEN, NITRITE, LEUKOCYTESUR in the last 72 hours.  Invalid input(s): APPERANCEUR     Component Value Date/Time   CHOL 138 01/18/2015 0325   TRIG 54 01/18/2015 0325   HDL 82 01/18/2015 0325   CHOLHDL 1.7 01/18/2015 0325   VLDL 11 01/18/2015 0325   LDLCALC 45 01/18/2015 0325   No results found for: HGBA1C No results found for: LABOPIA, COCAINSCRNUR, LABBENZ, AMPHETMU, THCU, LABBARB  No results for input(s): ETH in the last 168 hours.  Ct Head Wo Contrast 01/16/2015    Suspected acute LEFT cerebellar infarcts. See discussion above; additional imaging may be indicated.      MRI / MRA Brain Wo Contrast 01/16/2015    1. Left superior cerebellar territory acute nonhemorrhagic infarcts are confirmed.  2. Punctate acute nonhemorrhagic infarct in the right occipital pole.  3. Punctate acute nonhemorrhagic infarct posterior to the right caudate head.  4. Acute nonhemorrhagic 7 mm infarct within the posterior left frontal lobe coronal radiata.  5. Moderate atrophy and advanced confluent periventricular white matter disease bilaterally likely reflects the sequela of chronic microvascular ischemia.  6. Remote nonhemorrhagic right paramedian pontine infarct.  7. 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  8. Mild distal small vessel disease is present without other significant proximal stenosis, aneurysm, or branch vessel occlusion.     PHYSICAL EXAM  PHYSICAL EXAM Physical exam: Exam: Gen: NAD Eyes: anicteric sclerae, moist conjunctivae                    CV: no MRG, no carotid bruits, no peripheral edema Mental Status: Alert, poor historian, follows commands  Speech:    No aphasia, no dysarthria  Cranial Nerves:    The pupils are equal, round, and reactive  to light.. Attempted, Fundi not visualized.  EOMI. No gaze preference. Visual fields full. Face symmetric, Tongue midline. Hearing normal to voice. Gag intact.   Motor Observation:    no involuntary movements noted. Tone appears normal.     Strength:    Left lower extremity weakness, 3+/5.     Sensation:  Intact to LT  Plantars downgoing.   Cerebellar: no dysmetria     ASSESSMENT/PLAN [name] is a 81 y.o. female admitted with an acute inferior MI treated with PTCA, hypertension, diabetes mellitus, and question of atrial fibrillation, who developed left lower extremity weakness, gait disturbance, and dysarthria on 01/16/2015.   She did not receive IV t-PA due to being outside the TPA window.  Stroke:  Multiple bilateral infarcts as noted above probably embolic secondary to atrial fibrillation vs s/p catheterization MRI  - as above   MRA  - as above   Carotid Doppler - pending  2D Echo - EF 60-65%. No cardiac source of emboli identified.  LDL pending   HgbA1c pending  Subcutaneous heparin for VTE prophylaxis Diet heart healthy/carb modified Room service appropriate?: Yes; Fluid consistency:: Thin  no antithrombotic prior to admission, now on aspirin 81 mg orally every day and Plavix  Ongoing aggressive stroke risk factor management  Therapy recommendations: SNF  Disposition: Pending  Given age and cognitive status, unclear if TEE and Loop and possible long-term anticoagulation would be something family is interested in. Will await current workup and then discuss with primary team.   Hypertension  Home meds: Norvasc, Coreg, and Cozaar   Blood pressure mildly low at times 118/52 this a.m.- currently back on home meds for hypertension.   Hyperlipidemia  Home meds: Mevacor 40 mg daily - now on Lipitor 40 mg daily.  LDL pending, goal < 70  Continue statin at discharge  Diabetes  HgbA1c pending, goal < 7.0  Controlled  Other Stroke Risk  Factors  Advanced age  Hx stroke/TIA  Coronary artery disease  History of atrial fibrillation   Other Active Problems  Sodium 129   Renal insufficiency - BUN 24 / creatinine 1.27  Anemia 10.2 / 32.7  Recent MI  Other Pertinent History    Hospital day # 6   Personally examined patient and images, and have documented history, physical, neuro exam,assessment and plan as stated above.   A total of 25 minutes was spent in with this patient. Over half this time was spent on counseling patient on the embolic stroke diagnosis and different therapeutic options available with patient and grandaughter.    Janet DeanAntonia Kassady Laboy, MD Stroke Neurology [phone] [facility]           To contact Stroke Continuity provider, please refer to [email or web address]. After hours, contact General Neurology

## 2015-01-18 NOTE — Progress Notes (Signed)
VASCULAR LAB PRELIMINARY  PRELIMINARY  PRELIMINARY  PRELIMINARY  Carotid Dopplers completed.    Preliminary report:  1-39% ICA stenosis.  Right vertebral artery flow exhibits to-fro flow.  Left vertebral artery flow is antegrade.   Janice Bodine, RVT 01/18/2015, 11:23 AM

## 2015-01-18 NOTE — Progress Notes (Signed)
PT Note  Patient Details Name: Janet Bond MRN: [id number] DOB: 06/12/1923   Contact Note:    New PT order received following positive identification of CVA, pt currently active with acute therapies (see eval dated 4/22 noting left side deficits); recommending SNF for postacute rehab needs, note CSW working with family to find placement.  Will continue acute PT interventions with goal of d/c to SNF setting for continues services.  Thank you,   Dennis BastMartin, Nicholus Chandran Galloway 01/18/2015, 8:52 AM

## 2015-01-19 ENCOUNTER — Encounter (HOSPITAL_COMMUNITY): Payer: Self-pay | Admitting: Physician Assistant

## 2015-01-19 ENCOUNTER — Telehealth: Payer: Self-pay | Admitting: *Deleted

## 2015-01-19 DIAGNOSIS — E119 Type 2 diabetes mellitus without complications: Secondary | ICD-10-CM

## 2015-01-19 DIAGNOSIS — I639 Cerebral infarction, unspecified: Secondary | ICD-10-CM | POA: Diagnosis present

## 2015-01-19 DIAGNOSIS — I779 Disorder of arteries and arterioles, unspecified: Secondary | ICD-10-CM | POA: Diagnosis present

## 2015-01-19 DIAGNOSIS — I739 Peripheral vascular disease, unspecified: Secondary | ICD-10-CM

## 2015-01-19 DIAGNOSIS — E785 Hyperlipidemia, unspecified: Secondary | ICD-10-CM | POA: Diagnosis present

## 2015-01-19 DIAGNOSIS — Z8679 Personal history of other diseases of the circulatory system: Secondary | ICD-10-CM

## 2015-01-19 DIAGNOSIS — I2510 Atherosclerotic heart disease of native coronary artery without angina pectoris: Secondary | ICD-10-CM | POA: Diagnosis present

## 2015-01-19 DIAGNOSIS — I10 Essential (primary) hypertension: Secondary | ICD-10-CM | POA: Diagnosis present

## 2015-01-19 DIAGNOSIS — I350 Nonrheumatic aortic (valve) stenosis: Secondary | ICD-10-CM

## 2015-01-19 LAB — GLUCOSE, CAPILLARY
GLUCOSE-CAPILLARY: 109 mg/dL — AB (ref 70–99)
GLUCOSE-CAPILLARY: 114 mg/dL — AB (ref 70–99)
Glucose-Capillary: 108 mg/dL — ABNORMAL HIGH (ref 70–99)

## 2015-01-19 LAB — BASIC METABOLIC PANEL
Anion gap: 7 (ref 5–15)
BUN: 13 mg/dL (ref 6–23)
CALCIUM: 8.5 mg/dL (ref 8.4–10.5)
CHLORIDE: 94 mmol/L — AB (ref 96–112)
CO2: 29 mmol/L (ref 19–32)
Creatinine, Ser: 0.76 mg/dL (ref 0.50–1.10)
GFR, EST AFRICAN AMERICAN: 84 mL/min — AB (ref >90)
GFR, EST NON AFRICAN AMERICAN: 73 mL/min — AB (ref >90)
GLUCOSE: 107 mg/dL — AB (ref 70–99)
Potassium: 4.1 mmol/L (ref 3.5–5.1)
Sodium: 130 mmol/L — ABNORMAL LOW (ref 135–145)

## 2015-01-19 LAB — HEMOGLOBIN A1C
HEMOGLOBIN A1C: 5.9 % — AB (ref 4.8–5.6)
Mean Plasma Glucose: 123 mg/dL

## 2015-01-19 MED ORDER — METFORMIN HCL 1000 MG PO TABS: 1000.0000 mg | ORAL_TABLET | Freq: Two times a day (BID) | ORAL | Status: DC

## 2015-01-19 MED ORDER — ATORVASTATIN CALCIUM 40 MG PO TABS: 40.0000 mg | ORAL_TABLET | Freq: Every day | ORAL | Status: DC

## 2015-01-19 MED ORDER — AMLODIPINE BESYLATE 5 MG PO TABS: 5.0000 mg | ORAL_TABLET | Freq: Every day | ORAL | Status: DC

## 2015-01-19 MED ORDER — CLOPIDOGREL BISULFATE 75 MG PO TABS: 75.0000 mg | ORAL_TABLET | Freq: Every day | ORAL | Status: DC

## 2015-01-19 MED ORDER — PANTOPRAZOLE SODIUM 40 MG PO TBEC: 40.0000 mg | DELAYED_RELEASE_TABLET | Freq: Two times a day (BID) | ORAL | Status: DC

## 2015-01-19 MED ORDER — CARVEDILOL 12.5 MG PO TABS: 12.5000 mg | ORAL_TABLET | Freq: Two times a day (BID) | ORAL | Status: DC

## 2015-01-19 MED ORDER — ASPIRIN 81 MG PO TBEC: 81.0000 mg | DELAYED_RELEASE_TABLET | Freq: Four times a day (QID) | ORAL | Status: DC | PRN

## 2015-01-19 NOTE — Discharge Summary (Signed)
Discharge Summary   Patient ID: Janet Spangleaomi Espy MRN: [id number], DOB/AGE: 79/11/1922 79 y.o. Admit date: 01/12/2015 D/C date:     01/19/2015  Primary Cardiologist: Dr. Eldridge DaceVaranasi  Principal Problem:   Acute myocardial infarction of inferior wall Active Problems:   CVA (cerebral infarction)   Essential hypertension   Carotid artery disease   HLD (hyperlipidemia)   CAD (coronary artery disease)   Aortic stenosis   Diabetes mellitus   Hypertension   History of atrial fibrillation   Admission Dates: 01/09/15-01/19/15 Discharge Diagnosis: Acute inferior STEMI s/p PTCA/aspiration thrombectomy c/b acute CVA.   HPI: Janet Bond is a 81 y.o. female with a history of HTN, DM and questionable atrial fibrillation who [facility]15 with chest pain.    [facility] with chest discomfort associated with nausea and vomiting. She was found to have inferior ST elevations and was transferred [facility]. During transport she had some bradycardia but her BP remained stable.   Hospital Course  Acute inferior STEMI - s/p Cath with 50-60% prox LAD, moderate prox stenosis of D2, moderate stenosis of the LCX, occluded PL proximally s/p aspiration thrombectomy and PTCA of the PL. No stent was placed due to advanced age and not wanting to commit to DAPT.  -- 2D echo 04/19 EF 60-65%, no wall motion abnormalities, grade 2 diastolic dysfunction  -- Continue ASA/Plavix/statin/BB.  Acute CVA- called for difficulty walking and trouble moving her left leg on 01/16/15. CT with suspected cerebellar infarcts. Multiple acute punctate nonhemorrhagic infarcts seen on brain MRI. -- Will be discharged to SNF for rehabilitation -- Dr Excell Seltzerooper and the neurology team agreed that this patient does not need a TEE or implantable loop recorder with an apparent embolic event following an acute myocardial infarction and cardiac catheterization  Mild aortic stenosis- as well as mild AR. Mean gradient (S): 11 mm Hg.  Peak gradient (S): 20 mm Hg. Valve area (VTI): 1.36 cm^2. Valve area (Vmax): 1.17 cm^2. Valve area (Vmean): 1.14 cm^2. -- Continue to monitor  HTN - controlled on carvedilol 12.5 mg BID, amlodipine 5 mg daily, losartan 100 mg daily  DM- HgA1c 5.9. Metformin held for 48 hours post cath. Now resumed on home regimen.   Dyslipidemia - goal LDL is < 70 -- Continue atorvastatin 40mg  -- Will need repeat FLP and ALT in 6 weeks  Possible hx of atrial fibrillation- no evidence of atrial fibrillation or flutter while inpatient.   Carotid artery disease - carotid dopplers on 01/18/15 w/ bIlateral intimal wall thickening CCA. Moderate to severe calcific plaque origin and proximal ICA and ECA with acoustic shadowing. 1-39% ICA stenosis. Right vertebral artery flow is to-fro suggesting proximal stenosis.. Left vertebral artery flow is antegrade.   The patient has had a complicated hospital course but is recovering well. The femoral catheter site is stable. She has been seen by Dr. Excell Seltzerooper today and deemed ready for discharge home. All follow-up appointments have been scheduled. Discharge medications are listed below.   Discharge Vitals: Blood pressure 145/60, pulse 69, temperature 98 F (36.7 C), temperature source Oral, resp. rate 19, height 5\' 6"  (1.676 m), weight 121 lb 8 oz (55.112 kg), SpO2 97 %.  Labs: Lab Results  Component Value Date   WBC 4.5 01/15/2015   HGB 10.2* 01/15/2015   HCT 32.7* 01/15/2015   MCV 83.6 01/15/2015   PLT 182 01/15/2015     Recent Labs Lab 01/19/15 0410  NA 130*  K 4.1  CL 94*  CO2 29  BUN 13  CREATININE 0.76  CALCIUM 8.5  GLUCOSE 107*    Lab Results  Component Value Date   CHOL 138 01/18/2015   HDL 82 01/18/2015   LDLCALC 45 01/18/2015   TRIG 54 01/18/2015    Diagnostic Studies/Procedures   Ct Head Wo Contrast  01/16/2015   CLINICAL DATA:  New onset of LEFT lower extremity ataxia and slurred speech. Patient is status post cardiac catheterization  with revascularization of coronary arteries. Initial encounter.  EXAM: CT HEAD WITHOUT CONTRAST  TECHNIQUE: Contiguous axial images were obtained from the base of the skull through the vertex without intravenous contrast.  COMPARISON:  None.  FINDINGS: Normal for age cerebral volume. Hypoattenuation of white matter representing small vessel disease. Remote RIGHT paramedian pontine lacunar infarct.  An asymmetric area of slightly greater than 1 cm size hypoattenuation LEFT cerebellum seen best on image 8 series 2 could result in LEFT-sided dystaxia. In addition, there is an asymmetric similar size area of hypoattenuation LEFT superior cerebellum. The attenuation of these lesions suggest they could be acute or subacute LEFT cerebellar infarcts. CTA head neck and/or for MRI brain without and with contrast with MRA intracranial recommended for further evaluation.  Calvarium intact. No sinus or mastoid disease. Moderate vascular calcification in the carotid siphons. Negative orbits.  IMPRESSION: Suspected acute LEFT cerebellar infarcts. See discussion above; additional imaging may be indicated.  These results will be called to the ordering clinician or representative by the Radiologist Assistant, and communication documented in the PACS or zVision Dashboard.   Electronically Signed   By: Davonna Belling M.D.   On: 01/16/2015 15:45   Mr Brain Wo Contrast  01/16/2015   CLINICAL DATA:  Acute onset of gait instability and difficulty moving her left leg. Abnormal CT of the head. Cardiac catheterization 4 days ago.  EXAM: MRI HEAD WITHOUT CONTRAST  MRA HEAD WITHOUT CONTRAST  TECHNIQUE: Multiplanar, multiecho pulse sequences of the brain and surrounding structures were obtained without intravenous contrast. Angiographic images of the head were obtained using MRA technique without contrast.  COMPARISON:  CT head without contrast from the same day.  FINDINGS: MRI HEAD FINDINGS  Diffusion-weighted images confirmed acute  nonhemorrhagic infarcts involving the left superior cerebellum. A punctate nonhemorrhagic infarct is present in the right occipital pole. There is a punctate infarct along the posterior aspect of the right caudate head. Acute nonhemorrhagic infarct within the left coronal radiata measures 7 mm in the posterior left frontal lobe.  Moderate generalized atrophy is present. Extensive confluent periventricular white matter changes are noted bilaterally. Dilated perivascular spaces are present in the basal ganglia. A remote right paramedian pontine lacunar infarct is noted.  There is no flow within the right vertebral artery. The left vertebral artery and basilar artery are patent. Flow is present within the anterior circulation vessels.  Bilateral lens replacements are noted. The globes and orbits are otherwise intact. The paranasal sinuses and mastoid air cells are clear.  Advanced degenerative changes are present in the upper cervical spine. There is rightward curvature at the mid thoracic spine with leftward curvature at the craniocervical junction.  MRA HEAD FINDINGS  Moderate tortuosity is present in the cervical internal carotid arteries bilaterally without significant stenoses. The A1 and M1 segments are normal. The internal scratch the the metal scratch the the MCA bifurcations are intact. There is mild attenuation of distal MCA branch vessels. The anterior communicating artery is patent.  The right vertebral artery is occluded. There is some flow  above the right PICA. The left vertebral artery is normal. The left PICA origin is visualized and within normal limits. The basilar artery is normal. Both posterior cerebral arteries originate from the basilar tip. There is some attenuation of distal PCA branch vessels.  IMPRESSION: 1. Left superior cerebellar territory acute nonhemorrhagic infarcts are confirmed. 2. Punctate acute nonhemorrhagic infarct in the right occipital pole. 3. Punctate acute nonhemorrhagic  infarct posterior to the right caudate head. 4. Acute nonhemorrhagic 7 mm infarct within the posterior left frontal lobe coronal radiata. 5. Moderate atrophy and advanced confluent periventricular white matter disease bilaterally likely reflects the sequela of chronic microvascular ischemia. 6. Remote nonhemorrhagic right paramedian pontine infarct. 7. 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 8. Mild distal small vessel disease is present without other significant proximal stenosis, aneurysm, or branch vessel occlusion. These results were called by telephone at the time of interpretation on 01/16/2015 at 9:27 pm to Dr. Ritta Slot, who verbally acknowledged these results.   Electronically Signed   By: Marin Roberts M.D.   On: 01/16/2015 21:28   Mr Palma Holter  01/16/2015   CLINICAL DATA:  Acute onset of gait instability and difficulty moving her left leg. Abnormal CT of the head. Cardiac catheterization 4 days ago.  EXAM: MRI HEAD WITHOUT CONTRAST  MRA HEAD WITHOUT CONTRAST  TECHNIQUE: Multiplanar, multiecho pulse sequences of the brain and surrounding structures were obtained without intravenous contrast. Angiographic images of the head were obtained using MRA technique without contrast.  COMPARISON:  CT head without contrast from the same day.  FINDINGS: MRI HEAD FINDINGS  Diffusion-weighted images confirmed acute nonhemorrhagic infarcts involving the left superior cerebellum. A punctate nonhemorrhagic infarct is present in the right occipital pole. There is a punctate infarct along the posterior aspect of the right caudate head. Acute nonhemorrhagic infarct within the left coronal radiata measures 7 mm in the posterior left frontal lobe.  Moderate generalized atrophy is present. Extensive confluent periventricular white matter changes are noted bilaterally. Dilated perivascular spaces are present in the basal ganglia. A remote right paramedian pontine lacunar infarct  is noted.  There is no flow within the right vertebral artery. The left vertebral artery and basilar artery are patent. Flow is present within the anterior circulation vessels.  Bilateral lens replacements are noted. The globes and orbits are otherwise intact. The paranasal sinuses and mastoid air cells are clear.  Advanced degenerative changes are present in the upper cervical spine. There is rightward curvature at the mid thoracic spine with leftward curvature at the craniocervical junction.  MRA HEAD FINDINGS  Moderate tortuosity is present in the cervical internal carotid arteries bilaterally without significant stenoses. The A1 and M1 segments are normal. The internal scratch the the metal scratch the the MCA bifurcations are intact. There is mild attenuation of distal MCA branch vessels. The anterior communicating artery is patent.  The right vertebral artery is occluded. There is some flow above the right PICA. The left vertebral artery is normal. The left PICA origin is visualized and within normal limits. The basilar artery is normal. Both posterior cerebral arteries originate from the basilar tip. There is some attenuation of distal PCA branch vessels.  IMPRESSION: 1. Left superior cerebellar territory acute nonhemorrhagic infarcts are confirmed. 2. Punctate acute nonhemorrhagic infarct in the right occipital pole. 3. Punctate acute nonhemorrhagic infarct posterior to the right caudate head. 4. Acute nonhemorrhagic 7 mm infarct within the posterior left frontal lobe coronal radiata. 5. Moderate atrophy  and advanced confluent periventricular white matter disease bilaterally likely reflects the sequela of chronic microvascular ischemia. 6. Remote nonhemorrhagic right paramedian pontine infarct. 7. 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 8. Mild distal small vessel disease is present without other significant proximal stenosis, aneurysm, or branch vessel occlusion. These results  were called by telephone at the time of interpretation on 01/16/2015 at 9:27 pm to Dr. Ritta Slot, who verbally acknowledged these results.   Electronically Signed   By: Marin Roberts M.D.   On: 01/16/2015 21:28    I LHC 01/12/15  IMPRESSIONS:  1. Normal left main coronary artery. 2. Moderate disease in the left anterior descending artery and its branches. 3. Moderate disease in theleft circumflex artery and its branches. 4. Occluded posterior lateral artery branch of the right coronary artery. TIMI-3 flow was restored with aspiration thrombectomy. This was followed by balloon angioplasty. There was an excellent angiographic result and therefore a stent was not placed given the patient's age and the fact that we did not want to commit her to dual antiplatelet therapy. 5. Left ventricular systolic function was not assessed. LVEDP 6 mmHg.  RECOMMENDATION: Continue aggressive secondary prevention. Resume beta blocker. Will continue Angiomax until her current bag runs out. Hopefully, this will help with some of the slow flow in the distal posterior lateral branch. Start aspirin and Plavix for now although, these can be stopped if needed. She'll be watched in the ICU.    2D ECHO: 01/13/2015 LV EF: 60% -   65% Study Conclusions - Left ventricle: The cavity size was normal. There was mild   concentric hypertrophy. Systolic function was normal. The   estimated ejection fraction was in the range of 60% to 65%. Wall   motion was normal; there were no regional wall motion   abnormalities. Features are consistent with a pseudonormal left   ventricular filling pattern, with concomitant abnormal relaxation   and increased filling pressure (grade 2 diastolic dysfunction).   Doppler parameters are consistent with elevated ventricular   end-diastolic filling pressure. - Aortic valve: Cusp separation was moderately reduced. There was   mild stenosis. There was mild regurgitation.  Mean gradient (S):   11 mm Hg. Peak gradient (S): 20 mm Hg. Valve area (VTI): 1.36   cm^2. Valve area (Vmax): 1.17 cm^2. Valve area (Vmean): 1.14   cm^2. - Mitral valve: Calcified annulus. Mildly thickened leaflets . - Left atrium: The atrium was mildly dilated. - Right ventricle: Systolic function was normal. - Right atrium: The atrium was normal in size. - Pulmonary arteries: Systolic pressure was mildly increased. PA   peak pressure: 45 mm Hg (S). - Inferior vena cava: The vessel was normal in size. - Pericardium, extracardiac: There was no pericardial effusion.   Carotid Doppler: 01/18/15 Summary: BIlateral: intimal wall thickening CCA. Moderate to severe calcific plaque origin and proximal ICA and ECA with acoustic shadowing. 1-39% ICA stenosis. Right vertebral artery flow is to-fro suggesting proximal stenosis.. Left vertebral artery flow is antegrade.   Discharge Medications     Medication List    STOP taking these medications        lovastatin 40 MG tablet  Commonly known as:  MEVACOR      TAKE these medications        amLODipine 5 MG tablet  Commonly known as:  NORVASC  Take 1 tablet (5 mg total) by mouth daily.     aspirin 81 MG EC tablet  Take 1  tablet (81 mg total) by mouth every 6 (six) hours as needed for pain.     atorvastatin 40 MG tablet  Commonly known as:  LIPITOR  Take 1 tablet (40 mg total) by mouth daily at 6 PM.     carvedilol 12.5 MG tablet  Commonly known as:  COREG  Take 1 tablet (12.5 mg total) by mouth 2 (two) times daily with a meal.     clopidogrel 75 MG tablet  Commonly known as:  PLAVIX  Take 1 tablet (75 mg total) by mouth daily with breakfast.     losartan 100 MG tablet  Commonly known as:  COZAAR  Take 100 mg by mouth daily. For HTN     metFORMIN 1000 MG tablet  Commonly known as:  GLUCOPHAGE  Take 1 tablet (1,000 mg total) by mouth 2 (two) times daily with a meal. For diabetes     methocarbamol 500 MG tablet  Commonly  known as:  ROBAXIN  Take 500 mg by mouth 2 (two) times daily with a meal. For muscle spasms     pantoprazole 40 MG tablet  Commonly known as:  PROTONIX  Take 1 tablet (40 mg total) by mouth 2 (two) times daily before a meal.     traMADol 50 MG tablet  Commonly known as:  ULTRAM  Take one tablet by mouth three times daily as needed for pain     VITAMIN C PO  Take 1 tablet by mouth daily.        Disposition   The patient will be discharged in stable condition to home.  Follow-up Information    Follow up with Nicolasa Ducking, NP On 03/01/2015.   Specialty:  Nurse Practitioner   Why:  @ 9am    Contact information:   [address] [place] [address] [phone]         Duration of Discharge Encounter: Greater than 30 minutes including physician and PA time.  Byrd Hesselbach R PA-C 01/19/2015, 3:44 PM

## 2015-01-19 NOTE — Clinical Social Work Placement (Signed)
   CLINICAL SOCIAL WORK PLACEMENT  NOTE  Date:  01/19/2015  Patient Details  Name: Janet Bond MRN: [id number] Date of Birth: 11/29/1922  Clinical Social Work is seeking post-discharge placement for this patient at the Skilled  Nursing Facility level of care (*CSW will initial, date and re-position this form in  chart as items are completed):  Yes   Patient/family provided with [facility] Clinical Social Work Department's list of facilities offering this level of care within the geographic area requested by the patient (or if unable, by the patient's family).  Yes   Patient/family informed of their freedom to choose among providers that offer the needed level of care, that participate in Medicare, Medicaid or managed care program needed by the patient, have an available bed and are willing to accept the patient.  Yes   Patient/family informed of [facility]'s ownership interest in [facility] and [facility], as well as of the fact that they are under no obligation to receive care at these facilities.  PASRR submitted to EDS on       PASRR number received on       Existing PASRR number confirmed on 01/18/15     FL2 transmitted to all facilities in geographic area requested by pt/family on 01/18/15     FL2 transmitted to all facilities within larger geographic area on       Patient informed that his/her managed care company has contracts with or will negotiate with certain facilities, including the following:        Yes   Patient/family informed of bed offers received.  Patient [facility] bed at [facility]     Physician recommends and patient chooses bed at      [facility] 01[facility].  Patient to be transferred to facility by [facility]     Patient family notified on 01/19/15 of transfer.  Name of family member notified:  Melchor Amourndre Smith      PHYSICIAN Please sign FL2, Please prepare priority discharge summary,  including medications, Please prepare prescriptions     Additional Comment:    _______________________________________________ Sharol HarnessPoonum Abelino Tippin, Theresia MajorsLCSWA [phone]

## 2015-01-19 NOTE — Progress Notes (Signed)
CARDIAC REHAB PHASE I   PRE:  Rate/Rhythm: 135/60  BP:  Supine:   Sitting:   Standing:    SaO2: 94%RA  MODE:  Ambulation: 48 ft   POST:  Rate/Rhythm: 77 SR  BP:  Supine: 90/67,128/70  Sitting:   Standing:    SaO2: 95%RA 1050-1118 Pt alert and willing to walk. Pt walked 48 ft on RA with rolling walker and gait belt and asst x 2 with fairly steady gait. Did c/o left knee a little weak, but no left sided weakness visible to us. Denied dizziness but BP lower after walk. To recliner with bed alarm. [employer] family in room. Pt with call bell.   Luetta Nuttingharlene Phinehas Grounds, RN BSN  01/19/2015 11:13 AM  63 SR

## 2015-01-19 NOTE — Progress Notes (Addendum)
CSW (Clinical Child psychotherapistocial Worker) aware of plan for dc today. CS[facility]. Will present alternative bed offers to pt and pt family.   Emiline Mancebo, LCSWA [phone]

## 2015-01-19 NOTE — Progress Notes (Signed)
Physical Therapy Treatment Patient Details Name: Janet Bond MRN: [id number] DOB: [birth year] Today's Date: 01/19/2015    History of Present Illness Pt admitted 04/18 with acute inferior STEMI.  Pt underwent aspiration thrombectomy of the posterolateral artery (Right PAV), PCI of the posterolateral branch.  Then developed left sided weakness MRI positive for Left superior cerebellar territory acute nonhemorrhagic infarcts, Punctate acute nonhemorrhagic infarct in the right occipital pole, Punctate acute nonhemorrhagic infarct posterior to the right caudate head, and Acute nonhemorrhagic 7 mm infarct within the posterior left frontal lobe coronal radiata.  PMH - DM, HTN, pelvic fx    PT Comments    Patient progressing with mobility.  Still left LE weakness, though pt reports feels stronger.  Posterior bias with balance limits independence and safety as well.  Follow Up Recommendations  SNF     Equipment Recommendations  None recommended by PT    Recommendations for Other Services       Precautions / Restrictions Precautions Precautions: Fall    Mobility  Bed Mobility Overal bed mobility: Needs Assistance Bed Mobility: Sit to Supine;Supine to Sit     Supine to sit: Min assist Sit to supine: Min assist   General bed mobility comments: assist for scooting to edge of bed, to supine assist for positioning  Transfers Overall transfer level: Needs assistance Equipment used: Rolling walker (2 wheeled);None Transfers: Sit to/from Stand Sit to Stand: Min assist;Mod assist         General transfer comment: initially needed increased assist due to positioning of feet too far forward, repeated sit<> stand and cues for foot position improved independence  Ambulation/Gait Ambulation/Gait assistance: Min assist Ambulation Distance (Feet): 70 Feet Assistive device: Rolling walker (2 wheeled) Gait Pattern/deviations: Step-through pattern;Trunk flexed;Decreased stride length;Shuffle      General Gait Details: left knee buckling near end of ambulation, able to move left LE, but right hip trendelenberg noted as well   Stairs            Wheelchair Mobility    Modified Rankin (Stroke Patients Only)       Balance Overall balance assessment: Needs assistance         Standing balance support: Bilateral upper extremity supported Standing balance-Leahy Scale: Poor Standing balance comment: initially weight posterior, able to balance with min assist with walker improved anterior weight shift over time                    Cognition Arousal/Alertness: Awake/alert Behavior During Therapy: WFL for tasks assessed/performed Overall Cognitive Status: History of cognitive impairments - at baseline                      Exercises General Exercises - Lower Extremity Long Arc Quad: AROM;Both;10 reps;Seated Hip Flexion/Marching: AROM;Both;10 reps;Seated Toe Raises: AROM;Seated;10 reps;Both Heel Raises: AROM;Seated;10 reps;Both Other Exercises Other Exercises: sit<>stand x 5 reps, cues for foot position and anterior weight shift    General Comments        Pertinent Vitals/Pain Pain Assessment: Faces Faces Pain Scale: Hurts little more Pain Location: left knee with ambulation Pain Descriptors / Indicators: Other (Comment) (unable to describe sensation, but "bothers her") Pain Intervention(s): Monitored during session    Home Living                      Prior Function            PT Goals (current goals can now be found in the care plan  section) Progress towards PT goals: Progressing toward goals    Frequency       PT Plan Current plan remains appropriate    Co-evaluation             End of Session Equipment Utilized During Treatment: Gait belt Activity Tolerance: Patient tolerated treatment well Patient left: in bed;with call bell/phone within reach;with family/visitor present;with bed alarm set     Time:  1540-1600 PT Time Calculation (min) (ACUTE ONLY): 20 min  Charges:  $Gait Training: 8-22 mins                    G Codes:      Kasten Leveque,CYNDI 01/19/2015, 4:17 PM Sheran Lawlessyndi Cliford Sequeira, PT [phone] 01/19/2015

## 2015-01-19 NOTE — Progress Notes (Addendum)
Subjective:  Patient feels well this morning. She does complain of left leg weakness. No chest pain or shortness of breath.  Objective:  Vital Signs in the last 24 hours: Temp:  [98.2 F (36.8 C)-99.3 F (37.4 C)] 98.4 F (36.9 C) (04/25 0400) Pulse Rate:  [72-85] 72 (04/25 0400) Resp:  [18] 18 (04/25 0400) BP: (121-139)/(44-72) 139/72 mmHg (04/25 0400) SpO2:  [95 %-100 %] 97 % (04/25 0400) Weight:  [121 lb 8 oz (55.112 kg)] 121 lb 8 oz (55.112 kg) (04/25 0400)  Intake/Output from previous day: 04/24 0701 - 04/25 0700 In: 236 [P.O.:236] Out: 702 [Urine:700; Stool:2]  Physical Exam: Pt is alert and oriented, pleasant elderly woman in NAD HEENT: normal Neck: JVP - normal, carotids 2+= without bruits Lungs: CTA bilaterally CV: RRR grade 2/6 systolic ejection murmur at the right upper sternal border Abd: soft, NT, Positive BS, no hepatomegaly Ext: no C/C/E Skin: warm/dry no rash   Lab Results: No results for input(s): WBC, HGB, PLT in the last 72 hours.  Recent Labs  01/17/15 0320 01/19/15 0410  NA 129* 130*  K 4.0 4.1  CL 94* 94*  CO2 28 29  GLUCOSE 87 107*  BUN 24* 13  CREATININE 1.27* 0.76   No results for input(s): TROPONINI in the last 72 hours.  Invalid input(s): CK, MB  Cardiac Studies: 2-D echocardiogram: Study Conclusions  - Left ventricle: The cavity size was normal. There was mild concentric hypertrophy. Systolic function was normal. The estimated ejection fraction was in the range of 60% to 65%. Wall motion was normal; there were no regional wall motion abnormalities. Features are consistent with a pseudonormal left ventricular filling pattern, with concomitant abnormal relaxation and increased filling pressure (grade 2 diastolic dysfunction). Doppler parameters are consistent with elevated ventricular end-diastolic filling pressure. - Aortic valve: Cusp separation was moderately reduced. There was mild stenosis. There  was mild regurgitation. Mean gradient (S): 11 mm Hg. Peak gradient (S): 20 mm Hg. Valve area (VTI): 1.36 cm^2. Valve area (Vmax): 1.17 cm^2. Valve area (Vmean): 1.14 cm^2. - Mitral valve: Calcified annulus. Mildly thickened leaflets . - Left atrium: The atrium was mildly dilated. - Right ventricle: Systolic function was normal. - Right atrium: The atrium was normal in size. - Pulmonary arteries: Systolic pressure was mildly increased. PA peak pressure: 45 mm Hg (S). - Inferior vena cava: The vessel was normal in size. - Pericardium, extracardiac: There was no pericardial effusion.  Tele: Personally reviewed. Normal sinus rhythm with short runs of probable atrial tachycardia.  Assessment/Plan:  1. Inferior MI now status post PTCA/aspiration thrombectomy 2. Stroke: Multiple acute punctate nonhemorrhagic infarcts seen on brain MRI, appears left leg weakness his primary deficit 3. Mild aortic stenosis with preserved LV systolic function 4. Essential hypertension 5. Hyponatremia, stable 6. Acute kidney injury, now resolved  The patient appears stable from a cardiovascular perspective. She has previously lived independently. Will involve the case manager as I suspect she will require rehabilitation prior to her return home. Medical program reviewed and appears to be appropriate with aspirin, clopidogrel, a statin drug, and ARB, and a beta blocker. Blood glucoses and blood pressures reviewed and are well controlled. I agree with the neurology team that this patient does not need a TEE or implantable loop recorder with an apparent embolic event following an acute myocardial infarction and cardiac catheterization. I have reviewed her telemetry and do not see any evidence of atrial fibrillation or flutter. I think she is medically stable for  discharge to skilled nursing when a bed becomes available.  Tonny Bollman, M.D. 01/19/2015, 7:52 AM

## 2015-01-19 NOTE — Progress Notes (Signed)
Report called to [facility] at [facility].

## 2015-01-19 NOTE — Telephone Encounter (Signed)
-----   Message from Dewain PenningPatricia H Trent sent at 01/19/2015  2:31 PM EDT ----- TOC, being dcd to SNF today. STEMI & CVA. Will see Thayer Ohmhris on 5/5 @ 9am.  Thanks Trisha

## 2015-01-19 NOTE — Progress Notes (Signed)
CSW Proofreader(Clinical Social Worker) spoke with [facility] and [facility] multiple times. Pt was able to work out past billing issues with facility and they are able to accept her today. CSW confirmed this with Revonda StandardAllison and [place] with facility.   CSW (Clinical Child psychotherapistocial Worker) prepared pt dc packet and placed with shadow chart. CSW arranged non-emergent ambulance transport. Pt, pt family, pt nurse, and facility informed. CSW signing off.  Montel Vanderhoof, LCSWA [phone]

## 2015-01-19 NOTE — Telephone Encounter (Signed)
Pt discharged to SNF today 4/25.  [facility]t [facility] to a [facility]F.

## 2015-01-19 NOTE — Progress Notes (Signed)
Received call from CCMD stating patient having "burst of increased HR that appeared to be runs of AFIB".  Upon review of saved strips HR up to one teens to 120 non sustained.  Rhythm is regular.  EKG obtained-SR 1 degree HB with PAC's.  Saved strips appears to be Sinus Tachycardia (vs Aflutter-but no flutter waves seen). Fabio NeighborsKelli Willard RN also reviewed saved strips. Vitals stable,  Pt is asymptomatic and voiced no complaints except L leg/knee discomfort.  Will continue to monitor. Dierdre HighmanHall, Frady Taddeo Marie, RN

## 2015-01-19 NOTE — Discharge Instructions (Signed)

## 2015-01-19 NOTE — Progress Notes (Signed)
OT Cancellation Note  Patient Details Name: Janet Bond MRN: [id number] DOB: 10/19/1922   Cancelled Treatment:    Reason Eval/Treat Not Completed: OT screened. Pt's current D/C plan is SNF (spoke with Child psychotherapistsocial worker and pt planning to d/c today). No apparent immediate acute care OT needs, therefore will defer OT to SNF. If OT eval is needed please call Acute Rehab Dept. at [phone] or text page OT at [phone].    Earlie RavelingStraub, Adrick Kestler L  OTR/L [phone]  01/19/2015, 11:50 AM

## 2015-01-20 ENCOUNTER — Encounter: Payer: Self-pay | Admitting: Internal Medicine

## 2015-01-20 ENCOUNTER — Telehealth: Payer: Self-pay

## 2015-01-20 ENCOUNTER — Non-Acute Institutional Stay (SKILLED_NURSING_FACILITY): Payer: Medicare Other | Admitting: Internal Medicine

## 2015-01-20 DIAGNOSIS — I6340 Cerebral infarction due to embolism of unspecified cerebral artery: Secondary | ICD-10-CM

## 2015-01-20 DIAGNOSIS — E785 Hyperlipidemia, unspecified: Secondary | ICD-10-CM | POA: Diagnosis not present

## 2015-01-20 DIAGNOSIS — I779 Disorder of arteries and arterioles, unspecified: Secondary | ICD-10-CM | POA: Diagnosis not present

## 2015-01-20 DIAGNOSIS — E1159 Type 2 diabetes mellitus with other circulatory complications: Secondary | ICD-10-CM

## 2015-01-20 DIAGNOSIS — I739 Peripheral vascular disease, unspecified: Secondary | ICD-10-CM

## 2015-01-20 DIAGNOSIS — I480 Paroxysmal atrial fibrillation: Secondary | ICD-10-CM

## 2015-01-20 DIAGNOSIS — I25119 Atherosclerotic heart disease of native coronary artery with unspecified angina pectoris: Secondary | ICD-10-CM

## 2015-01-20 DIAGNOSIS — I350 Nonrheumatic aortic (valve) stenosis: Secondary | ICD-10-CM | POA: Diagnosis not present

## 2015-01-20 DIAGNOSIS — I2119 ST elevation (STEMI) myocardial infarction involving other coronary artery of inferior wall: Secondary | ICD-10-CM

## 2015-01-20 DIAGNOSIS — I10 Essential (primary) hypertension: Secondary | ICD-10-CM

## 2015-01-20 NOTE — Assessment & Plan Note (Signed)
Acute inferior STEMI - s/p Cath with 50-60% prox LAD, moderate prox stenosis of D2, moderate stenosis of the LCX, occluded PL proximally s/p aspiration thrombectomy and PTCA of the PL. No stent was placed due to advanced age and not wanting to commit to DAPT.  -- 2D echo 04/19 EF 60-65%, no wall motion abnormalities, grade 2 diastolic dysfunction  -- Continue ASA/Plavix/statin/BB.

## 2015-01-20 NOTE — Telephone Encounter (Signed)
Pt d/c on 4/25 to [facility] no TOC call needed.

## 2015-01-20 NOTE — Assessment & Plan Note (Signed)
as well as mild AR. Mean gradient (S): 11 mm Hg. Peak gradient (S): 20 mm Hg. Valve area (VTI): 1.36cm^2. Valve area (Vmax): 1.17 cm^2. Valve area (Vmean): 1.14cm^2. -- Continue to monitor

## 2015-01-20 NOTE — Assessment & Plan Note (Signed)
controlled on carvedilol 12.5 mg BID, amlodipine 5 mg daily, losartan 100 mg daily

## 2015-01-20 NOTE — Assessment & Plan Note (Signed)
carotid dopplers on 01/18/15 w/ bIlateral intimal wall thickening CCA. Moderate to severe calcific plaque origin and proximal ICA and ECA with acoustic shadowing. 1-39% ICA stenosis. Right vertebral artery flow is to-fro suggesting proximal stenosis.. Left vertebral artery flow is antegrade.

## 2015-01-20 NOTE — Assessment & Plan Note (Signed)
goal LDL is < 70 -- Continue atorvastatin 40mg  -- Will need repeat FLP and ALT in 6 weeks

## 2015-01-20 NOTE — Progress Notes (Signed)
STROKE TEAM PROGRESS NOTE   HISTORY Janet Bond is an 81 y.o. female with history of HTN, DM, question of A fib admitted with MI. S/P cardiac cath and revascularization on 4/18. Hospital course uncomplicated until 4/22 when noted difficulty walking and trouble moving her left leg. CT head ordered, imaging reviewed and shows suspected left cerebellar infarcts.   Date last known well: Unable to determine Time last known well: Unable to determine tPA Given: no, outside tPA window  SUBJECTIVE (INTERVAL HISTORY) Patient is a poor historian. But says she was doing much better. No significant weakness. Cardiology did not think she has afib and she is going to be discharged with dural antiplatelet.    OBJECTIVE Temp:  [98 F (36.7 C)-99.5 F (37.5 C)] 99.5 F (37.5 C) (04/25 1659) Pulse Rate:  [69-85] 75 (04/25 1659) Cardiac Rhythm:  [-] Normal sinus rhythm (04/25 0800) Resp:  [18-20] 19 (04/25 1533) BP: (128-159)/(52-80) 159/80 mmHg (04/25 1659) SpO2:  [96 %-100 %] 100 % (04/25 1659) Weight:  [121 lb 8 oz (55.112 kg)] 121 lb 8 oz (55.112 kg) (04/25 0400)   Recent Labs Lab 01/18/15 1641 01/18/15 2110 01/19/15 0736 01/19/15 1111 01/19/15 1646  GLUCAP 107* 133* 109* 108* 114*    Recent Labs Lab 01/14/15 1042 01/15/15 0556 01/16/15 0628 01/17/15 0320 01/19/15 0410  NA 128* 130* 130* 129* 130*  K 3.9 4.6 3.4* 4.0 4.1  CL 97 99 94* 94* 94*  CO2 GLUCOSE 162* 134* 103* 87 107*  BUN 24* 13  CREATININE 0.91 0.98 1.09 1.27* 0.76  CALCIUM 9.1 9.0 8.8 8.7 8.5   No results for input(s): AST, ALT, ALKPHOS, BILITOT, PROT, ALBUMIN in the last 168 hours.  Recent Labs Lab 01/13/15 0430 01/14/15 0253 01/15/15 0556  WBC 5.0 5.7 4.5  HGB 9.2* 9.7* 10.2*  HCT 28.5* 30.9* 32.7*  MCV 81.9 83.1 83.6  PLT 181 187 182    Recent Labs Lab 01/13/15 0215 01/13/15 0430 01/13/15 1125 01/13/15 1600 01/13/15 2116  CKTOTAL 956* 967*  --   --   --   CKMB 134.9*  137.3*  --   --   --   TROPONINI 56.23*  --  48.81* 46.10* 30.99*   No results for input(s): LABPROT, INR in the last 72 hours. No results for input(s): COLORURINE, LABSPEC, PHURINE, GLUCOSEU, HGBUR, BILIRUBINUR, KETONESUR, PROTEINUR, UROBILINOGEN, NITRITE, LEUKOCYTESUR in the last 72 hours.  Invalid input(s): APPERANCEUR     Component Value Date/Time   CHOL 138 01/18/2015 0325   TRIG 54 01/18/2015 0325   HDL 82 01/18/2015 0325   CHOLHDL 1.7 01/18/2015 0325   VLDL 11 01/18/2015 0325   LDLCALC 45 01/18/2015 0325   Lab Results  Component Value Date   HGBA1C 5.9* 01/18/2015   No results found for: LABOPIA, COCAINSCRNUR, LABBENZ, AMPHETMU, THCU, LABBARB  No results for input(s): ETH in the last 168 hours.  Ct Head Wo Contrast 01/16/2015    Suspected acute LEFT cerebellar infarcts. See discussion above; additional imaging may be indicated.     MRI / MRA Brain Wo Contrast 01/16/2015    1. Left superior cerebellar territory acute nonhemorrhagic infarcts are confirmed.  2. Punctate acute nonhemorrhagic infarct in the right occipital pole.  3. Punctate acute nonhemorrhagic infarct posterior to the right caudate head.  4. Acute nonhemorrhagic 7 mm infarct within the posterior left frontal lobe coronal radiata.  5. Moderate atrophy and advanced confluent periventricular white matter disease bilaterally likely  reflects the sequela of chronic microvascular ischemia.  6. Remote nonhemorrhagic right paramedian pontine infarct.  7. 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  8. Mild distal small vessel disease is present without other significant proximal stenosis, aneurysm, or branch vessel occlusion.  CUS - 1-39% ICA stenosis. Right vertebral artery flow exhibits to-fro flow. Left vertebral artery flow is antegrade.   2D echo - - Left ventricle: The cavity size was normal. There was mild concentric hypertrophy. Systolic function was normal. The estimated  ejection fraction was in the range of 60% to 65%. Wall motion was normal; there were no regional wall motion abnormalities. Features are consistent with a pseudonormal left ventricular filling pattern, with concomitant abnormal relaxation and increased filling pressure (grade 2 diastolic dysfunction). Doppler parameters are consistent with elevated ventricular end-diastolic filling pressure. - Aortic valve: Cusp separation was moderately reduced. There was mild stenosis. There was mild regurgitation. Mean gradient (S): 11 mm Hg. Peak gradient (S): 20 mm Hg. Valve area (VTI): 1.36 cm^2. Valve area (Vmax): 1.17 cm^2. Valve area (Vmean): 1.14 cm^2. - Mitral valve: Calcified annulus. Mildly thickened leaflets . - Left atrium: The atrium was mildly dilated. - Right ventricle: Systolic function was normal. - Right atrium: The atrium was normal in size. - Pulmonary arteries: Systolic pressure was mildly increased. PA peak pressure: 45 mm Hg (S). - Inferior vena cava: The vessel was normal in size. - Pericardium, extracardiac: There was no pericardial effusion.   PHYSICAL EXAM  Gen: NAD Eyes: anicteric sclerae, moist conjunctivae                    CV: no MRG, no carotid bruits, no peripheral edema Mental Status: Alert, poor historian, follows commands, not orientated to year and decreased knowledge.   Speech:    No aphasia, no dysarthria  Cranial Nerves:    The pupils are equal, round, and reactive to light.. Attempted, Fundi not visualized.  EOMI. No gaze preference. Visual fields full. Face symmetric, Tongue midline. Hearing normal to voice. Gag intact.   Motor Observation:    no involuntary movements noted. Tone appears normal.     Strength:    Left lower extremity weakness, 5-/5.     Sensation:  Intact to LT  Plantars downgoing.   Cerebellar: no dysmetria  ASSESSMENT/PLAN [name] is a 81 y.o. female admitted with an acute inferior MI treated  with PTCA, hypertension, diabetes mellitus, and question of atrial fibrillation, who developed left lower extremity weakness, gait disturbance, and dysarthria on 01/16/2015.   She did not receive IV t-PA due to being outside the TPA window.  Stroke:  Multiple bilateral infarcts as noted above probably embolic secondary to MI vs s/p catheterization  MRI  - as above   MRA  - as above   Carotid Doppler - unremarkable  2D Echo - EF 60-65%. No cardiac source of emboli identified.  LDL 45  HgbA1c 5.9  Subcutaneous heparin for VTE prophylaxis  no antithrombotic prior to admission, now on aspirin 81 mg orally every day and Plavix daily  Ongoing aggressive stroke risk factor management  Therapy recommendations: SNF  Disposition: Pending  ? Afib  Cardiology Dr. Excell Seltzer looked at tele monitoring and did not see evidence of afib  No anticoagulation needed at this time  On dural antiplatelet.   Hypertension  Home meds: Norvasc, Coreg, and Cozaar   Blood pressure mildly low at times 118/52 this a.m.  currently back on home  meds for hypertension.   Hyperlipidemia  Home meds: Mevacor 40 mg daily - now on Lipitor 40 mg daily.  LDL 45, goal < 70  Continue statin at discharge  Diabetes  HgbA1c 5.9, goal < 7.0  Controlled  Other Stroke Risk Factors  Advanced age  Hx stroke/TIA  Coronary artery disease  Other Active Problems  Sodium 129   Renal insufficiency - BUN 24 / creatinine 1.27  Anemia 10.2 / 32.7  Recent MI  Other Pertinent History    Hospital day # 7   Marvel PlanJindong Taiyana Kissler, MD PhD Stroke Neurology 01/20/2015 12:41 AM     To contact Stroke Continuity provider, please refer to [email or web address]. After hours, contact General Neurology

## 2015-01-20 NOTE — Assessment & Plan Note (Signed)
A1c 5.9;glucophage held for 48 hrs post cath then restarted

## 2015-01-20 NOTE — Assessment & Plan Note (Signed)
called for difficulty walking and trouble moving her left leg on 01/16/15. CT with suspected cerebellar infarcts. Multiple acute punctate nonhemorrhagic infarcts seen on brain MRI. -- Will be discharged to SNF for rehabilitation -- Dr Excell Seltzerooper and the neurology team agreed that this patient does not need a TEE or implantable loop recorder with an apparent embolic event following an acute myocardial infarction and cardiac catheterization

## 2015-01-20 NOTE — Assessment & Plan Note (Signed)
Felt to have occurred but not picked up on any monitors

## 2015-01-21 DIAGNOSIS — I639 Cerebral infarction, unspecified: Secondary | ICD-10-CM | POA: Insufficient documentation

## 2015-01-25 NOTE — Progress Notes (Signed)
MRN: [id number] Name: Janet Bond  Sex: female Age: 81 y.o. DOB: 10/03/1922  [facility]: [facility] Facility/Room: 507 Level Of Care: SNF Provider: Merrilee Seashore D Emergency Contacts: Extended Emergency Contact Information Primary Emergency Contact: [facility] Address: [address]          [place], [place] [address] [place] Home Phone: [phone] Mobile Phone: [phone] Relation: Grandaughter Secondary Emergency Contact: [facility] Address: [address]          [place], [place] [address] [place] Mobile Phone: [phone] Relation: Grandaughter  Code Status:   Allergies: Influenza vaccines; Aspirin; Chicken allergy; and Codeine  Chief Complaint  Patient presents with  . New Admit To SNF    HPI: Patient is 81 y.o. female who was hospitalized with acute inf MI, had an embolic cerebellar stroke 2/2 with L leg weakness, now admitted to SNF for OT/PT.  Past Medical History  Diagnosis Date  . Hypertension   . Diabetes mellitus   . History of atrial fibrillation     a. questionable hx of this.   . CVA (cerebral infarction)     a. 01/16/15: embolic event following an acute myocardial infarction and cardiac catheterization  . Aortic stenosis     a. mild by 2D ECHO 4/2016L  Mean gradient (S): 11 mm Hg. Peak gradient (S): 20 mm Hg. Valve area (VTI): 1.36 cm^2. Valve area (Vmax): 1.17 cm^2. Valve area (Vmean): 1.14 cm^2.  Marland Kitchen CAD (coronary artery disease)     a. 01/09/15: Acute inf STEMI s/p PTCA/aspiration thrombectomy    . HLD (hyperlipidemia)   . Carotid artery disease     a. carotid dopplers: 01/18/15 w/ bIlateral intimal wall thickening CCA. Moderate to severe calcific plaque origin and proximal ICA and ECA with acoustic shadowing. 1-39% ICA stenosis. Right vertebral artery flow is to-fro suggesting proximal stenosis    Past Surgical History  Procedure Laterality Date  . Wrist fracture surgery    . Leg surgery    . Left heart  catheterization with coronary angiogram N/A 01/12/2015    Procedure: LEFT HEART CATHETERIZATION WITH CORONARY ANGIOGRAM;  Surgeon: Corky Crafts, MD;  Location: [facility] CATH LAB;  Service: Cardiovascular;  Laterality: N/A;      Medication List       This list is accurate as of: 01/20/15 11:59 PM.  Always use your most recent med list.               amLODipine 5 MG tablet  Commonly known as:  NORVASC  Take 1 tablet (5 mg total) by mouth daily.     aspirin 81 MG EC tablet  Take 1 tablet (81 mg total) by mouth every 6 (six) hours as needed for pain.     atorvastatin 40 MG tablet  Commonly known as:  LIPITOR  Take 1 tablet (40 mg total) by mouth daily at 6 PM.     carvedilol 12.5 MG tablet  Commonly known as:  COREG  Take 1 tablet (12.5 mg total) by mouth 2 (two) times daily with a meal.     clopidogrel 75 MG tablet  Commonly known as:  PLAVIX  Take 1 tablet (75 mg total) by mouth daily with breakfast.     losartan 100 MG tablet  Commonly known as:  COZAAR  Take 100 mg by mouth daily. For HTN     metFORMIN 1000 MG tablet  Commonly known as:  GLUCOPHAGE  Take 1 tablet (1,000 mg total) by mouth  2 (two) times daily with a meal. For diabetes     methocarbamol 500 MG tablet  Commonly known as:  ROBAXIN  Take 500 mg by mouth 2 (two) times daily with a meal. For muscle spasms     pantoprazole 40 MG tablet  Commonly known as:  PROTONIX  Take 1 tablet (40 mg total) by mouth 2 (two) times daily before a meal.     traMADol 50 MG tablet  Commonly known as:  ULTRAM  Take one tablet by mouth three times daily as needed for pain     VITAMIN C PO  Take 1 tablet by mouth daily.        No orders of the defined types were placed in this encounter.    Immunization History  Administered Date(s) Administered  . Influenza-Unspecified 08/06/2013  . PPD Test 11/08/2013  . Tdap 09/08/2012    History  Substance Use Topics  . Smoking status: Never Smoker   . Smokeless tobacco:  Not on file  . Alcohol Use: No    Family history is noncontributory    Review of Systems  DATA OBTAINED: from patient, nurse GENERAL:  no fevers, fatigue, appetite changes SKIN: No itching, rash or wounds EYES: No eye pain, redness, discharge EARS: No earache, tinnitus, change in hearing NOSE: No congestion, drainage or bleeding  MOUTH/THROAT: No mouth or tooth pain, No sore throat RESPIRATORY: No cough, wheezing, SOB CARDIAC: No chest pain, palpitations, lower extremity edema  GI: No abdominal pain, No N/V/D or constipation, No heartburn or reflux  GU: No dysuria, frequency or urgency, or incontinence  MUSCULOSKELETAL: No unrelieved bone/joint pain NEUROLOGIC: No headache,  Some dizziness admits feel like lightheaded ness, not vertigo PSYCHIATRIC: No overt anxiety or sadness, No behavior issue.   Filed Vitals:   01/20/15 1550  BP: 128/60  Pulse: 64  Temp: 97.7 F (36.5 C)  Resp: 20    Physical Exam  GENERAL APPEARANCE: Alert, conversant,very pleasant BF No acute distress.  SKIN: No diaphoresis rash HEAD: Normocephalic, atraumatic  EYES: Conjunctiva/lids clear. Pupils round, reactive. EOMs intact.  EARS: External exam WNL, canals clear. Hearing grossly normal.  NOSE: No deformity or discharge.  MOUTH/THROAT: Lips w/o lesions  RESPIRATORY: Breathing is even, unlabored. Lung sounds are clear   CARDIOVASCULAR: Heart RRR no murmurs, rubs or gallops. No peripheral edema.   GASTROINTESTINAL: Abdomen is soft, non-tender, not distended w/ normal bowel sounds. GENITOURINARY: Bladder non tender, not distended  MUSCULOSKELETAL: No abnormal joints or musculature NEUROLOGIC:  Cranial nerves 2-12 grossly intact; no nystagmus  PSYCHIATRIC: Mood and affect appropriate to situation, no behavioral issues  Patient Active Problem List   Diagnosis Date Noted  . Stroke   . Carotid artery disease   . HLD (hyperlipidemia)   . CAD (coronary artery disease)   . Aortic stenosis   . CVA  (cerebral infarction)   . Diabetes mellitus   . Hypertension   . History of atrial fibrillation   . Acute myocardial infarction of inferior wall   . Essential hypertension   . Atrial fibrillation     CBC    Component Value Date/Time   WBC 4.5 01/15/2015 0556   RBC 3.91 01/15/2015 0556   HGB 10.2* 01/15/2015 0556   HCT 32.7* 01/15/2015 0556   PLT 182 01/15/2015 0556   MCV 83.6 01/15/2015 0556   LYMPHSABS 1.4 01/12/2015 2139   MONOABS 0.7 01/12/2015 2139   EOSABS 0.2 01/12/2015 2139   BASOSABS 0.0 01/12/2015 2139    CMP  Component Value Date/Time   NA 130* 01/19/2015 0410   K 4.1 01/19/2015 0410   CL 94* 01/19/2015 0410   CO2 29 01/19/2015 0410   GLUCOSE 107* 01/19/2015 0410   BUN 13 01/19/2015 0410   CREATININE 0.76 01/19/2015 0410   CALCIUM 8.5 01/19/2015 0410   GFRNONAA 73* 01/19/2015 0410   GFRAA 84* 01/19/2015 0410    Assessment and Plan  Acute myocardial infarction of inferior wall Acute inferior STEMI - s/p Cath with 50-60% prox LAD, moderate prox stenosis of D2, moderate stenosis of the LCX, occluded PL proximally s/p aspiration thrombectomy and PTCA of the PL. No stent was placed due to advanced age and not wanting to commit to DAPT.  -- 2D echo 04/19 EF 60-65%, no wall motion abnormalities, grade 2 diastolic dysfunction  -- Continue ASA/Plavix/statin/BB.    CVA (cerebral infarction) called for difficulty walking and trouble moving her left leg on 01/16/15. CT with suspected cerebellar infarcts. Multiple acute punctate nonhemorrhagic infarcts seen on brain MRI. -- Will be discharged to SNF for rehabilitation -- Dr Excell Seltzerooper and the neurology team agreed that this patient does not need a TEE or implantable loop recorder with an apparent embolic event following an acute myocardial infarction and cardiac catheterization   Aortic stenosis as well as mild AR. Mean gradient (S): 11 mm Hg. Peak gradient (S): 20 mm Hg. Valve area (VTI): 1.36cm^2. Valve area  (Vmax): 1.17 cm^2. Valve area (Vmean): 1.14cm^2. -- Continue to monitor    Essential hypertension controlled on carvedilol 12.5 mg BID, amlodipine 5 mg daily, losartan 100 mg daily    Diabetes mellitus A1c 5.9;glucophage held for 48 hrs post cath then restarted   HLD (hyperlipidemia) goal LDL is < 70 -- Continue atorvastatin 40mg  -- Will need repeat FLP and ALT in 6 weeks   Carotid artery disease carotid dopplers on 01/18/15 w/ bIlateral intimal wall thickening CCA. Moderate to severe calcific plaque origin and proximal ICA and ECA with acoustic shadowing. 1-39% ICA stenosis. Right vertebral artery flow is to-fro suggesting proximal stenosis.. Left vertebral artery flow is antegrade.    Atrial fibrillation Felt to have occurred but not picked up on any monitors    Pt seen 01/20/2015 Margit HanksALEXANDER, Benen Weida D, MD

## 2015-01-29 ENCOUNTER — Encounter: Payer: Medicare Other | Admitting: Nurse Practitioner

## 2015-01-29 ENCOUNTER — Ambulatory Visit (INDEPENDENT_AMBULATORY_CARE_PROVIDER_SITE_OTHER): Payer: Medicare Other | Admitting: Nurse Practitioner

## 2015-01-29 ENCOUNTER — Encounter: Payer: Self-pay | Admitting: Nurse Practitioner

## 2015-01-29 VITALS — BP 140/62 | HR 76 | Ht 65.0 in | Wt 127.4 lb

## 2015-01-29 DIAGNOSIS — I2119 ST elevation (STEMI) myocardial infarction involving other coronary artery of inferior wall: Secondary | ICD-10-CM | POA: Diagnosis not present

## 2015-01-29 DIAGNOSIS — I2510 Atherosclerotic heart disease of native coronary artery without angina pectoris: Secondary | ICD-10-CM | POA: Diagnosis not present

## 2015-01-29 DIAGNOSIS — I6340 Cerebral infarction due to embolism of unspecified cerebral artery: Secondary | ICD-10-CM

## 2015-01-29 DIAGNOSIS — E785 Hyperlipidemia, unspecified: Secondary | ICD-10-CM | POA: Diagnosis not present

## 2015-01-29 DIAGNOSIS — I10 Essential (primary) hypertension: Secondary | ICD-10-CM | POA: Diagnosis not present

## 2015-01-29 NOTE — Progress Notes (Signed)
Patient Name: Janet Bond Date of Encounter: 01/29/2015  Primary Care Provider:  No primary care provider on file. Primary Cardiologist:  Catalina Gravel, MD   Chief Complaint  81 year old female status post recent inferior MI requiring thrombectomy and PTCA, complicated by embolic stroke, who presents for follow-up.  Past Medical History   Past Medical History  Diagnosis Date  . Essential hypertension   . Type II diabetes mellitus   . Questionable History of Atrial Fibrillation     a. questionable hx of this->no afib during 12/2014 hospitalization.  . CVA (cerebral infarction)     a. 01/16/15: embolic event following an acute myocardial infarction and cardiac catheterization  . Mild Aortic Stenosis     a. mild by 2D ECHO 12/2014:  Mean gradient (S): 11 mm Hg. Peak gradient (S): 20 mm Hg. Valve area (VTI): 1.36 cm^2. Valve area (Vmax): 1.17 cm^2. Valve area (Vmean): 1.14 cm^2.  Marland Kitchen CAD (coronary artery disease)     a. 01/09/15: Acute inf STEMI/PCI: LM nl, LAD/LCX mod dzs, RPL 100 (s/p PTCA/aspiration thrombectomy).  Marland Kitchen HLD (hyperlipidemia)   . Carotid artery disease     a. carotid dopplers: 01/18/15 w/ bIlateral intimal wall thickening CCA. Moderate to severe calcific plaque origin and proximal ICA and ECA with acoustic shadowing. 1-39% ICA stenosis. Right vertebral artery flow is to-fro suggesting proximal stenosis  . H/O echocardiogram     a. 12/2014 Echo: EF 60-65%, Gr 2 DD, mild AS/AI.   Past Surgical History  Procedure Laterality Date  . Wrist fracture surgery    . Leg surgery    . Left heart catheterization with coronary angiogram N/A 01/12/2015    Procedure: LEFT HEART CATHETERIZATION WITH CORONARY ANGIOGRAM;  Surgeon: Corky Crafts, MD;  Location: [facility] CATH LAB;  Service: Cardiovascular;  Laterality: N/A;    Allergies  Allergies  Allergen Reactions  . Influenza Vaccines Nausea And Vomiting  . Aspirin Nausea And Vomiting  . Chicken Allergy Nausea And Vomiting  . Codeine  Nausea And Vomiting    HPI  81 year old female with the above complex problem list. She was admitted to [facility] in mid April with complaints of chest pain and inferior ST segment elevation. She underwent diagnostic catheterization revealing complete occlusion of the right posterior lateral branch of the right coronary artery. This was successfully treated with aspiration thrombectomy and balloon angioplasty. Stenting was not undertaken given advanced age and not wanting to commit to long-term dual antiplatelet therapy. Echo showed normal LV function with grade 2 diastolic dysfunction. Patient initially did well postprocedure but later developed nausea and on April 22, she was noted to have difficulty walking and moving her left leg. A code stroke was called. CT of the head showed suspected left cerebellar infarcts. Infarcts were felt to be embolic in nature and likely secondary to recent catheterization. She had no evidence of atrial fibrillation during admission. She was maintained on aspirin and Plavix therapy and subsequently discharged to rehabilitation on April 25.    Since discharge, she has been doing well. She says that she "is getting more exercise than Zenaida Niece Camp's has beans." With that, her strength is improving and she thinks that her left leg strength is just about as good as her right. She has not been having any chest pain or dyspnea. She is tolerating medications well. She denies PND, orthopnea, dizziness, syncopal, edema, or early satiety.  Home Medications  Prior to Admission medications   Medication Sig Start Date End Date Taking? Authorizing Provider  amLODipine (NORVASC) 5 MG tablet Take 1 tablet (5 mg total) by mouth daily. 01/19/15   Janetta HoraKathryn R Thompson, PA-C  Ascorbic Acid (VITAMIN C PO) Take 1 tablet by mouth daily.    Historical Provider, MD  aspirin 81 MG EC tablet Take 1 tablet (81 mg total) by mouth every 6 (six) hours as needed for pain. 01/19/15   Janetta HoraKathryn R Thompson, PA-C    atorvastatin (LIPITOR) 40 MG tablet Take 1 tablet (40 mg total) by mouth daily at 6 PM. 01/19/15   Janetta HoraKathryn R Thompson, PA-C  carvedilol (COREG) 12.5 MG tablet Take 1 tablet (12.5 mg total) by mouth 2 (two) times daily with a meal. 01/19/15   Janetta HoraKathryn R Thompson, PA-C  clopidogrel (PLAVIX) 75 MG tablet Take 1 tablet (75 mg total) by mouth daily with breakfast. 01/19/15   Janetta HoraKathryn R Thompson, PA-C  losartan (COZAAR) 100 MG tablet Take 100 mg by mouth daily. For HTN    Historical Provider, MD  metFORMIN (GLUCOPHAGE) 1000 MG tablet Take 1 tablet (1,000 mg total) by mouth 2 (two) times daily with a meal. For diabetes 01/19/15   Janetta HoraKathryn R Thompson, PA-C  methocarbamol (ROBAXIN) 500 MG tablet Take 500 mg by mouth 2 (two) times daily with a meal. For muscle spasms    Historical Provider, MD  pantoprazole (PROTONIX) 40 MG tablet Take 1 tablet (40 mg total) by mouth 2 (two) times daily before a meal. 01/19/15   Janetta HoraKathryn R Thompson, PA-C  traMADol Janean Sark(ULTRAM) 50 MG tablet Take one tablet by mouth three times daily as needed for pain 11/11/13   Kermit Baloiffany L Reed, DO    Review of Systems  As above, strength is improving. She denies chest pain, palpitations, dyspnea, pnd, orthopnea, n, v, dizziness, syncope, edema, weight gain, or early satiety.  All other systems reviewed and are otherwise negative except as noted above.  Physical Exam  VS:  Ht 7' (2.134 m)  Wt 127 lb 6.4 oz (57.788 kg)  BMI 12.69 kg/m2 , BMI Body mass index is 12.69 kg/(m^2). GEN: Well nourished, well developed, in no acute distress. HEENT: normal. Neck: Supple, no JVD, carotid bruits, or masses. Cardiac: RRR, rubs, or gallops. 3/6 SEM RUSB.  No clubbing, cyanosis, edema.  Radials/DP/PT 2+ and equal bilaterally. R groin cath site w/o bleeding/bruit/hematoma. Respiratory:  Respirations regular and unlabored, clear to auscultation bilaterally. GI: Soft, nontender, nondistended, BS + x 4. MS: no deformity or atrophy. Skin: warm and dry, no  rash. Neuro:  Strength and sensation are intact. Psych: Normal affect.  Accessory Clinical Findings  ECG - regular sinus rhythm, 76, first-degree AV block, right axis, inferior T-wave inversion-unchanged from prior ECGs.   Assessment & Plan  1.  Inferior ST elevation MI, subsequent episode of care/coronary artery disease: Status post recent admission with inferior MI complicated by embolic stroke. She is status post PCI and stenting of the right posterior lateral branch of the right coronary artery. she is doing well and tolerating medications well. She has not had any chest pain or dyspnea. She remains on aspirin, statin, beta blocker, Plavix, and ARB therapy. She will need when necessary nitrates added at discharge from rehabilitation.   2. Status post CVA: Currently in rehabilitation and doing well. She says she is likely to be discharged prior to previously anticipated date. She does live by [place] but has a nephew close by.   3. Essential hypertension: stable. Continue beta blocker and ARB.   4. Hyperlipidemia: LDL was 45 during  hospitalization. She is currently on moderate dose Lipitor. She will need to have LFTsupon follow-up.   5. Type 2 diabetes mellitus: On metformin.   6. Disposition: Follow-up with Dr. Eldridge DaceVaranasi in 3 months or sooner if necessary.    Nicolasa Duckinghristopher Isay Perleberg, NP 01/29/2015, 9:24 AM

## 2015-01-29 NOTE — Patient Instructions (Signed)
Medication Instructions:  Your physician recommends that you continue on your current medications as directed. Please refer to the Current Medication list given to you today.   Labwork:   Testing/Procedures:   Follow-Up:  IN 3 MONTHS WITH DR Eldridge DaceVARANASI  Any Other Special Instructions Will Be Listed Below (If Applicable).

## 2015-02-03 ENCOUNTER — Encounter: Payer: Self-pay | Admitting: Interventional Cardiology

## 2015-02-05 ENCOUNTER — Encounter: Payer: Self-pay | Admitting: Internal Medicine

## 2015-02-05 ENCOUNTER — Non-Acute Institutional Stay (SKILLED_NURSING_FACILITY): Payer: Medicare Other | Admitting: Internal Medicine

## 2015-02-05 DIAGNOSIS — I2119 ST elevation (STEMI) myocardial infarction involving other coronary artery of inferior wall: Secondary | ICD-10-CM | POA: Diagnosis not present

## 2015-02-05 DIAGNOSIS — I639 Cerebral infarction, unspecified: Secondary | ICD-10-CM | POA: Diagnosis not present

## 2015-02-05 DIAGNOSIS — I10 Essential (primary) hypertension: Secondary | ICD-10-CM

## 2015-02-05 DIAGNOSIS — I350 Nonrheumatic aortic (valve) stenosis: Secondary | ICD-10-CM

## 2015-02-05 NOTE — Progress Notes (Signed)
Patient ID: Janet Bond, female   DOB: 1923-08-22, 81 y.o.   MRN: [id number] MRN: [id number] Name: Mehar Sagen  Sex: female Age: 81 y.o. DOB: 01/18/1923  [facility] Facility/Room: 507 Level Of Care: SNF Provider: Roena Malady Emergency Contacts: Extended Emergency Contact Information Primary Emergency Contact: [facility] Address: [address]          [place], [place] [address] [place] Home Phone: [phone] Mobile Phone: [phone] Relation: Grandaughter Secondary Emergency Contact: [facility] Address: [address]          [place], [place] [address] [place] Mobile Phone: [phone] Relation: Grandaughter  Code Status:   Allergies: Codeine; Influenza vaccines; Aspirin; and Chicken allergy  Chief Complaint  Patient presents with  . Discharge Note    HPI: Patient is 81 y.o. female who was hospitalized with acute inf MI, had an embolic cerebellar stroke 2/2 with L leg weakness,was admitted to SNF for OT/PT She has done very well is ambulating fairly well with a walker-.  Vital signs appear stable she has no complaints of shortness of breath or chest pain.  She has been seen by cardiology and thought to be doing well.  .  Past Medical History  Diagnosis Date  . Essential hypertension   . Type II diabetes mellitus   . Questionable History of Atrial Fibrillation     a. questionable hx of this->no afib during 12/2014 hospitalization.  . CVA (cerebral infarction)     a. 01/16/15: embolic event following an acute myocardial infarction and cardiac catheterization  . Mild Aortic Stenosis     a. mild by 2D ECHO 12/2014:  Mean gradient (S): 11 mm Hg. Peak gradient (S): 20 mm Hg. Valve area (VTI): 1.36 cm^2. Valve area (Vmax): 1.17 cm^2. Valve area (Vmean): 1.14 cm^2.  Marland Kitchen CAD (coronary artery disease)     a. 01/09/15: Acute inf STEMI/PCI: LM nl, LAD/LCX mod dzs, RPL 100 (s/p PTCA/aspiration thrombectomy).  Marland Kitchen HLD (hyperlipidemia)    . Carotid artery disease     a. carotid dopplers: 01/18/15 w/ bIlateral intimal wall thickening CCA. Moderate to severe calcific plaque origin and proximal ICA and ECA with acoustic shadowing. 1-39% ICA stenosis. Right vertebral artery flow is to-fro suggesting proximal stenosis  . H/O echocardiogram     a. 12/2014 Echo: EF 60-65%, Gr 2 DD, mild AS/AI.    Past Surgical History  Procedure Laterality Date  . Wrist fracture surgery    . Leg surgery    . Left heart catheterization with coronary angiogram N/A 01/12/2015    Procedure: LEFT HEART CATHETERIZATION WITH CORONARY ANGIOGRAM;  Surgeon: Corky Crafts, MD;  Location[facility];  Service: Cardiovascular;  Laterality: N/A;      Medication List       This list is accurate as of: 02/05/15  7:58 PM.  Always use your most recent med list.               amLODipine 5 MG tablet  Commonly known as:  NORVASC  Take 1 tablet (5 mg total) by mouth daily.     aspirin 81 MG EC tablet  Take 1 tablet (81 mg total) by mouth every 6 (six) hours as needed for pain.     atorvastatin 40 MG tablet  Commonly known as:  LIPITOR  Take 1 tablet (40 mg total) by mouth daily at 6 PM.     carvedilol 12.5 MG tablet  Commonly known as:  COREG  Take 1 tablet (12.5 mg total) by mouth 2 (two) times daily with a meal.     clopidogrel 75 MG tablet  Commonly known as:  PLAVIX  Take 1 tablet (75 mg total) by mouth daily with breakfast.     losartan 100 MG tablet  Commonly known as:  COZAAR  Take 100 mg by mouth daily. For HTN     metFORMIN 1000 MG tablet  Commonly known as:  GLUCOPHAGE  Take 1 tablet (1,000 mg total) by mouth 2 (two) times daily with a meal. For diabetes     methocarbamol 500 MG tablet  Commonly known as:  ROBAXIN  Take 500 mg by mouth 2 (two) times daily with a meal. For muscle spasms     pantoprazole 40 MG tablet  Commonly known as:  PROTONIX  Take 1 tablet (40 mg total) by mouth 2 (two) times daily before a meal.      traMADol 50 MG tablet  Commonly known as:  ULTRAM  Take one tablet by mouth three times daily as needed for pain     VITAMIN C PO  Take 1 tablet by mouth daily.        No orders of the defined types were placed in this encounter.    Immunization History  Administered Date(s) Administered  . Influenza-Unspecified 08/06/2013  . PPD Test 11/08/2013  . Tdap 09/08/2012    History  Substance Use Topics  . Smoking status: Never Smoker   . Smokeless tobacco: Not on file  . Alcohol Use: No    Family history is noncontributory    Review of Systems  DATA OBTAINED: from patient, nurse GENERAL:  no fevers, fatigue, appetite changes SKIN: No itching, rash or wounds EYES: No eye pain, redness, discharge EARS: No earache, tinnitus, change in hearing NOSE: No congestion, drainage or bleeding  MOUTH/THROAT: No mouth or tooth pain, No sore throat RESPIRATORY: No cough, wheezing, SOB CARDIAC: No chest pain, palpitations, lower extremity edema  GI: No abdominal pain, No N/V/D or constipation, No heartburn or reflux  GU: No dysuria, frequency or urgency, or incontinence  MUSCULOSKELETAL: No unrelieved bone/joint pain--has gained strength NEUROLOGIC: No headache, had complain of dizziness in the past but does not complain of that this evening PSYCHIATRIC: No overt anxiety or sadness, No behavior issue.   Filed Vitals:   02/05/15 1956  BP: 129/63  Pulse: 76  Temp: 97.1 F (36.2 C)  Resp: 16    Physical Exam  GENERAL APPEARANCE: Alert, conversant,very pleasant BF No acute distress.  SKIN: No diaphoresis rash HEAD: Normocephalic, atraumatic  EYES: Conjunctiva/lids clear. Pupils round, reactive. EOMs intact.  EARS: External exam WNL, canals clear. Hearing grossly normal.  NOSE: No deformity or discharge.  MOUTH/THROAT: Oropharynx is clear mucous membranes moist  RESPIRATORY: Breathing is even, unlabored. Lung sounds are clear   CARDIOVASCULAR: Heart RRR  2-3/6 systolic murmur .  No peripheral edema.   GASTROINTESTINAL: Abdomen is soft, non-tender, not distended w/ normal bowel sounds. GENITOURINARY: Bladder non tender, not distended  MUSCULOSKELETAL: No abnormal joints or musculature--is able to stand without assistance and ambulate with a walker NEUROLOGIC:  Cranial nerves 2-12 grossly intact; no nystagmus --difficult to pick up really any focal weaknesses PSYCHIATRIC: Mood and affect appropriate to situation, no behavioral issues  Patient Active Problem List   Diagnosis Date Noted  . Stroke   . Carotid artery disease   . HLD (hyperlipidemia)   . CAD (coronary artery disease)   . Aortic stenosis   .  CVA (cerebral infarction)   . Diabetes mellitus   . Hypertension   . History of atrial fibrillation   . Acute myocardial infarction of inferior wall   . Essential hypertension   . Atrial fibrillation     CBC    Component Value Date/Time   WBC 4.5 01/15/2015 0556   RBC 3.91 01/15/2015 0556   HGB 10.2* 01/15/2015 0556   HCT 32.7* 01/15/2015 0556   PLT 182 01/15/2015 0556   MCV 83.6 01/15/2015 0556   LYMPHSABS 1.4 01/12/2015 2139   MONOABS 0.7 01/12/2015 2139   EOSABS 0.2 01/12/2015 2139   BASOSABS 0.0 01/12/2015 2139    CMP     Component Value Date/Time   NA 130* 01/19/2015 0410   K 4.1 01/19/2015 0410   CL 94* 01/19/2015 0410   CO2 29 01/19/2015 0410   GLUCOSE 107* 01/19/2015 0410   BUN 13 01/19/2015 0410   CREATININE 0.76 01/19/2015 0410   CALCIUM 8.5 01/19/2015 0410   GFRNONAA 73* 01/19/2015 0410   GFRAA 84* 01/19/2015 0410    Assessment and Plan  History of acute myocardial infarction of inferior wall-she appears to have made a good recovery here she has been followed by cardiology she is on aspirin and Plavix as well as a statin and beta blocker this appears to be stable.  #2 history CVA with difficulty walking and moving her left leg initially-per per MRI multiple acute nonhemorrhagic infarcts.  Not a candidate for an implantable  loop recorder or a TEE per neurology-she is again done quite well here is ambulating well now with a walker.  #3 history of aortic stenosis this appears to be stable per cardiology.  #4 history of hypertension this is stable on Coreg as well as Norvasc and valsartan recent blood pressures 129/63-120/69. #5-history diabetes hemoglobin A1c in hospital was 5.9 she is on Glucophage.  #6 history of hyperlipidemia-with goal LDL less than 70-she is on atorvastatin-recommendation to repeat lipid panel and liver function tests in approximately 4 weeks.  Check a CBC BMP tomorrow notify primary care provider of results  It is my understanding she will be going home she has a very supportive nephew who lives nearby she is used to being independent she will need expedient follow-up by cardiology and primary care provider as well as continued PT and OT and home health support for further strengthening with her numerous medical issues including recent MI as well as CVA   CPT [phone]

## 2015-02-06 ENCOUNTER — Encounter: Payer: Self-pay | Admitting: Internal Medicine

## 2015-02-06 ENCOUNTER — Non-Acute Institutional Stay (SKILLED_NURSING_FACILITY): Payer: Medicare Other | Admitting: Internal Medicine

## 2015-02-06 DIAGNOSIS — Z7189 Other specified counseling: Secondary | ICD-10-CM | POA: Diagnosis not present

## 2015-02-06 NOTE — Progress Notes (Signed)
MRN: [id number] Name: Janet Bond  Sex: female Age: 81 y.o. DOB: 12/20/1922  [facility] Facility/Room:507 Level Of Care: SNF Provider: Merrilee SeashoreALEXANDER, Jerry Haugen D Emergency Contacts: Extended Emergency Contact Information Primary Emergency Contact: [facility] Imith,Andre Address: [address]          [place], [place] [address] [place] Home Phone: [phone] Mobile Phone: [phone] Relation: Grandaughter Secondary Emergency Contact: [facility] Address: [address]          [place], [place] [address] [place] Mobile Phone: [phone] Relation: Grandaughter  Code Status:   Allergies: Codeine; Influenza vaccines; Aspirin; and Chicken allergy  Chief Complaint  Patient presents with  . Acute Visit    HPI: Patient is 81 y.o. female whose family want to meet with me to discuss d/c living arrangements with the patient who is currently living alone but has good family support.  Past Medical History  Diagnosis Date  . Essential hypertension   . Type II diabetes mellitus   . Questionable History of Atrial Fibrillation     a. questionable hx of this->no afib during 12/2014 hospitalization.  . CVA (cerebral infarction)     a. 01/16/15: embolic event following an acute myocardial infarction and cardiac catheterization  . Mild Aortic Stenosis     a. mild by 2D ECHO 12/2014:  Mean gradient (S): 11 mm Hg. Peak gradient (S): 20 mm Hg. Valve area (VTI): 1.36 cm^2. Valve area (Vmax): 1.17 cm^2. Valve area (Vmean): 1.14 cm^2.  Marland Kitchen. CAD (coronary artery disease)     a. 01/09/15: Acute inf STEMI/PCI: LM nl, LAD/LCX mod dzs, RPL 100 (s/p PTCA/aspiration thrombectomy).  Marland Kitchen. HLD (hyperlipidemia)   . Carotid artery disease     a. carotid dopplers: 01/18/15 w/ bIlateral intimal wall thickening CCA. Moderate to severe calcific plaque origin and proximal ICA and ECA with acoustic shadowing. 1-39% ICA stenosis. Right vertebral artery flow is to-fro suggesting proximal stenosis   . H/O echocardiogram     a. 12/2014 Echo: EF 60-65%, Gr 2 DD, mild AS/AI.    Past Surgical History  Procedure Laterality Date  . Wrist fracture surgery    . Leg surgery    . Left heart catheterization with coronary angiogram N/A 01/12/2015    Procedure: LEFT HEART CATHETERIZATION WITH CORONARY ANGIOGRAM;  Surgeon: Corky CraftsJayadeep S Varanasi, MD;  [facility];  Service: Cardiovascular;  Laterality: N/A;      Medication List       This list is accurate as of: 02/06/15 11:59 PM.  Always use your most recent med list.               amLODipine 5 MG tablet  Commonly known as:  NORVASC  Take 1 tablet (5 mg total) by mouth daily.     aspirin 81 MG EC tablet  Take 1 tablet (81 mg total) by mouth every 6 (six) hours as needed for pain.     atorvastatin 40 MG tablet  Commonly known as:  LIPITOR  Take 1 tablet (40 mg total) by mouth daily at 6 PM.     carvedilol 12.5 MG tablet  Commonly known as:  COREG  Take 1 tablet (12.5 mg total) by mouth 2 (two) times daily with a meal.     clopidogrel 75 MG tablet  Commonly known as:  PLAVIX  Take 1 tablet (75 mg total) by mouth daily with breakfast.     losartan 100 MG tablet  Commonly known as:  COZAAR  Take  100 mg by mouth daily. For HTN     metFORMIN 1000 MG tablet  Commonly known as:  GLUCOPHAGE  Take 1 tablet (1,000 mg total) by mouth 2 (two) times daily with a meal. For diabetes     methocarbamol 500 MG tablet  Commonly known as:  ROBAXIN  Take 500 mg by mouth 2 (two) times daily with a meal. For muscle spasms     pantoprazole 40 MG tablet  Commonly known as:  PROTONIX  Take 1 tablet (40 mg total) by mouth 2 (two) times daily before a meal.     traMADol 50 MG tablet  Commonly known as:  ULTRAM  Take one tablet by mouth three times daily as needed for pain     VITAMIN C PO  Take 1 tablet by mouth daily.        No orders of the defined types were placed in this encounter.    Immunization History  Administered  Date(s) Administered  . Influenza-Unspecified 08/06/2013  . PPD Test 11/08/2013  . Tdap 09/08/2012    History  Substance Use Topics  . Smoking status: Never Smoker   . Smokeless tobacco: Not on file  . Alcohol Use: No    Review of Systems  DATA OBTAINED: from patient, nurse,  family member; no c/o, pt doing well GENERAL:  no fevers, fatigue, appetite changes SKIN: No itching, rash HEENT: No complaint RESPIRATORY: No cough, wheezing, SOB CARDIAC: No chest pain, palpitations, lower extremity edema  GI: No abdominal pain, No N/V/D or constipation, No heartburn or reflux  GU: No dysuria, frequency or urgency, or incontinence  MUSCULOSKELETAL: No unrelieved bone/joint pain NEUROLOGIC: No headache, dizziness  PSYCHIATRIC: No overt anxiety or sadness  Filed Vitals:   02/06/15 1328  BP: 120/69  Pulse: 70  Temp: 97.6 F (36.4 C)  Resp: 17    Physical Exam  GENERAL APPEARANCE: Alert, conversant, No acute distress BF SKIN: No diaphoresis ras HEENT: Unremarkable RESPIRATORY: Breathing is even, unlabored   CARDIOVASCULAR: . No peripheral edema  GASTROINTESTINAL: Abdomen is not distended  GENITOURINARY: Bladder non tender, not distended  MUSCULOSKELETAL: No abnormal joints or musculature NEUROLOGIC: Cranial nerves 2-12 grossly intact. Moves all extremities PSYCHIATRIC: Mood and affect appropriate to situation, no behavioral issues  Patient Active Problem List   Diagnosis Date Noted  . Encounter for family conference with patient present 02/07/2015  . Stroke   . Carotid artery disease   . HLD (hyperlipidemia)   . CAD (coronary artery disease)   . Aortic stenosis   . CVA (cerebral infarction)   . Diabetes mellitus   . Hypertension   . History of atrial fibrillation   . Acute myocardial infarction of inferior wall   . Essential hypertension   . Atrial fibrillation     CBC    Component Value Date/Time   WBC 4.5 01/15/2015 0556   RBC 3.91 01/15/2015 0556   HGB  10.2* 01/15/2015 0556   HCT 32.7* 01/15/2015 0556   PLT 182 01/15/2015 0556   MCV 83.6 01/15/2015 0556   LYMPHSABS 1.4 01/12/2015 2139   MONOABS 0.7 01/12/2015 2139   EOSABS 0.2 01/12/2015 2139   BASOSABS 0.0 01/12/2015 2139    CMP     Component Value Date/Time   NA 130* 01/19/2015 0410   K 4.1 01/19/2015 0410   CL 94* 01/19/2015 0410   CO2 29 01/19/2015 0410   GLUCOSE 107* 01/19/2015 0410   BUN 13 01/19/2015 0410   CREATININE 0.76 01/19/2015 0410  CALCIUM 8.5 01/19/2015 0410   GFRNONAA 73* 01/19/2015 0410   GFRAA 84* 01/19/2015 0410    Assessment and Plan  Encounter for family conference with patient present Pt is being discharged in several days and family has asked my assistance in talking grandmama into ALF. She currently lives alone in her own home and is a very functional 81 yo. Two grand daughters, SW and I had an extensive conversation about powers of attorney and living options.. In the end pt went with in- home assistance coming in every day to her home and granddaughters doing the banking.    Pt seen 02/04/2015; time spent > 25 min Margit HanksALEXANDER, Aryn Safran D, MD

## 2015-02-07 ENCOUNTER — Encounter: Payer: Self-pay | Admitting: Internal Medicine

## 2015-02-07 DIAGNOSIS — Z7189 Other specified counseling: Secondary | ICD-10-CM | POA: Insufficient documentation

## 2015-02-07 NOTE — Assessment & Plan Note (Signed)
Pt is being discharged in several days and family has asked my assistance in talking grandmama into ALF. She currently lives alone in her own home and is a very functional 81 yo. Two grand daughters, SW and I had an extensive conversation about powers of attorney and living options.. In the end pt went with in- home assistance coming in every day to her home and granddaughters doing the banking.

## 2015-03-17 ENCOUNTER — Non-Acute Institutional Stay (SKILLED_NURSING_FACILITY): Payer: Medicare Other | Admitting: Internal Medicine

## 2015-03-17 ENCOUNTER — Encounter: Payer: Self-pay | Admitting: Internal Medicine

## 2015-03-17 DIAGNOSIS — I483 Typical atrial flutter: Secondary | ICD-10-CM

## 2015-03-17 DIAGNOSIS — I2111 ST elevation (STEMI) myocardial infarction involving right coronary artery: Secondary | ICD-10-CM | POA: Diagnosis not present

## 2015-03-17 DIAGNOSIS — I4892 Unspecified atrial flutter: Secondary | ICD-10-CM | POA: Insufficient documentation

## 2015-03-17 DIAGNOSIS — E1159 Type 2 diabetes mellitus with other circulatory complications: Secondary | ICD-10-CM

## 2015-03-17 DIAGNOSIS — I6340 Cerebral infarction due to embolism of unspecified cerebral artery: Secondary | ICD-10-CM

## 2015-03-17 DIAGNOSIS — E785 Hyperlipidemia, unspecified: Secondary | ICD-10-CM

## 2015-03-17 DIAGNOSIS — I10 Essential (primary) hypertension: Secondary | ICD-10-CM

## 2015-03-17 DIAGNOSIS — I213 ST elevation (STEMI) myocardial infarction of unspecified site: Secondary | ICD-10-CM | POA: Insufficient documentation

## 2015-03-17 NOTE — Assessment & Plan Note (Signed)
Post cath complication 2 months ago;no problem this cath

## 2015-03-17 NOTE — Assessment & Plan Note (Signed)
Continue lipitor 40mg  

## 2015-03-17 NOTE — Assessment & Plan Note (Signed)
Continue glucophage ;A1c 2 months ago 5.9

## 2015-03-17 NOTE — Progress Notes (Signed)
MRN: [id number] Name: Janet Bond  Sex: female Age: 81 y.o. DOB: 08-Sep-1923  [facility] #: [facility] Facility/Room:103 Level Of Care: SNF Provider: Merrilee Seashore D Emergency Contacts: Extended Emergency Contact Information Primary Emergency Contact: [facility] Address: [address]          [place], [place] [address] [place] Home Phone: [phone] Mobile Phone: [phone] Relation: Grandaughter Secondary Emergency Contact: [facility] Address: [address]          [place], [place] [address] [place] Mobile Phone: [phone] Relation: Grandaughter  Code Status: FULL  Allergies: Codeine; Influenza vaccines; Aspirin; and Chicken allergy  Chief Complaint  Patient presents with  . New Admit To SNF    HPI: Patient is 81 y.o. female who is admitted to SNF for OT/PT afyter STEMI, s/p cath at [facility].  Past Medical History  Diagnosis Date  . Essential hypertension   . Type II diabetes mellitus   . Questionable History of Atrial Fibrillation     a. questionable hx of this->no afib during 12/2014 hospitalization.  . CVA (cerebral infarction)     a. 01/16/15: embolic event following an acute myocardial infarction and cardiac catheterization  . Mild Aortic Stenosis     a. mild by 2D ECHO 12/2014:  Mean gradient (S): 11 mm Hg. Peak gradient (S): 20 mm Hg. Valve area (VTI): 1.36 cm^2. Valve area (Vmax): 1.17 cm^2. Valve area (Vmean): 1.14 cm^2.  Marland Kitchen CAD (coronary artery disease)     a. 01/09/15: Acute inf STEMI/PCI: LM nl, LAD/LCX mod dzs, RPL 100 (s/p PTCA/aspiration thrombectomy).  Marland Kitchen HLD (hyperlipidemia)   . Carotid artery disease     a. carotid dopplers: 01/18/15 w/ bIlateral intimal wall thickening CCA. Moderate to severe calcific plaque origin and proximal ICA and ECA with acoustic shadowing. 1-39% ICA stenosis. Right vertebral artery flow is to-fro suggesting proximal stenosis  . H/O echocardiogram     a. 12/2014 Echo: EF 60-65%, Gr 2 DD, mild  AS/AI.    Past Surgical History  Procedure Laterality Date  . Wrist fracture surgery    . Leg surgery    . Left heart catheterization with coronary angiogram N/A 01/12/2015    Procedure: LEFT HEART CATHETERIZATION WITH CORONARY ANGIOGRAM;  Surgeon: Corky Crafts, MD;  Location: [facility] CATH LAB;  Service: Cardiovascular;  Laterality: N/A;      Medication List       This list is accurate as of: 03/17/15  8:35 PM.  Always use your most recent med list.               atorvastatin 40 MG tablet  Commonly known as:  LIPITOR  Take 1 tablet (40 mg total) by mouth daily at 6 PM.     carvedilol 12.5 MG tablet  Commonly known as:  COREG  Take 1 tablet (12.5 mg total) by mouth 2 (two) times daily with a meal.     clopidogrel 75 MG tablet  Commonly known as:  PLAVIX  Take 1 tablet (75 mg total) by mouth daily with breakfast.     diltiazem 180 MG 24 hr capsule  Commonly known as:  DILACOR XR  Take 180 mg by mouth daily.     ELIQUIS 2.5 MG Tabs tablet  Generic drug:  apixaban  Take 2.5 mg by mouth 2 (two) times daily.     ferrous sulfate 325 (65 FE) MG tablet  Take 325 mg by mouth daily with breakfast.     furosemide 20 MG  tablet  Commonly known as:  LASIX  Take 20 mg by mouth.     lisinopril 5 MG tablet  Commonly known as:  PRINIVIL,ZESTRIL  Take 5 mg by mouth daily.     megestrol 400 MG/10ML suspension  Commonly known as:  MEGACE  Take 400 mg by mouth daily.     metFORMIN 1000 MG tablet  Commonly known as:  GLUCOPHAGE  Take 1 tablet (1,000 mg total) by mouth 2 (two) times daily with a meal. For diabetes     nitroGLYCERIN 0.4 MG SL tablet  Commonly known as:  NITROSTAT  Place 0.4 mg under the tongue every 5 (five) minutes as needed for chest pain.     pantoprazole 40 MG tablet  Commonly known as:  PROTONIX  Take 1 tablet (40 mg total) by mouth 2 (two) times daily before a meal.     traMADol 50 MG tablet  Commonly known as:  ULTRAM  Take one tablet by mouth three  times daily as needed for pain        Meds ordered this encounter  Medications  . apixaban (ELIQUIS) 2.5 MG TABS tablet    Sig: Take 2.5 mg by mouth 2 (two) times daily.  Marland Kitchen diltiazem (DILACOR XR) 180 MG 24 hr capsule    Sig: Take 180 mg by mouth daily.  Marland Kitchen lisinopril (PRINIVIL,ZESTRIL) 5 MG tablet    Sig: Take 5 mg by mouth daily.  . megestrol (MEGACE) 400 MG/10ML suspension    Sig: Take 400 mg by mouth daily.  . ferrous sulfate 325 (65 FE) MG tablet    Sig: Take 325 mg by mouth daily with breakfast.  . furosemide (LASIX) 20 MG tablet    Sig: Take 20 mg by mouth.  . nitroGLYCERIN (NITROSTAT) 0.4 MG SL tablet    Sig: Place 0.4 mg under the tongue every 5 (five) minutes as needed for chest pain.    Immunization History  Administered Date(s) Administered  . Influenza-Unspecified 08/06/2013  . PPD Test 11/08/2013  . Tdap 09/08/2012    History  Substance Use Topics  . Smoking status: Never Smoker   . Smokeless tobacco: Not on file  . Alcohol Use: No    Family history is noncontributory    Review of Systems  DATA OBTAINED: from patient, nurse- BP running low GENERAL:  no fevers, fatigue, appetite changes SKIN: No itching, rash or wounds EYES: No eye pain, redness, discharge EARS: No earache, tinnitus, change in hearing NOSE: No congestion, drainage or bleeding  MOUTH/THROAT: No mouth or tooth pain, No sore throat RESPIRATORY: No cough, wheezing, SOB CARDIAC: No chest pain, palpitations, lower extremity edema  GI: No abdominal pain, No N/V/D or constipation, No heartburn or reflux  GU: No dysuria, frequency or urgency, or incontinence  MUSCULOSKELETAL: No unrelieved bone/joint pain NEUROLOGIC: No headache, dizziness or focal weakness PSYCHIATRIC: No overt anxiety or sadness, No behavior issue.   There were no vitals filed for this visit.  Physical Exam  GENERAL APPEARANCE: Alert, conversant,  No acute distress.  SKIN: No diaphoresis rash HEAD: Normocephalic,  atraumatic  EYES: Conjunctiva/lids clear. Pupils round, reactive. EOMs intact.  EARS: External exam WNL, canals clear. Hearing grossly normal.  NOSE: No deformity or discharge.  MOUTH/THROAT: Lips w/o lesions  RESPIRATORY: Breathing is even, unlabored. Lung sounds are clear   CARDIOVASCULAR: Heart RRR no murmurs, rubs or gallops. No peripheral edema.   GASTROINTESTINAL: Abdomen is soft, non-tender, not distended w/ normal bowel sounds. GENITOURINARY: Bladder non tender, not  distended  MUSCULOSKELETAL: No abnormal joints or musculature NEUROLOGIC:  Cranial nerves 2-12 grossly intact. Moves all extremities  PSYCHIATRIC: Mood and affect appropriate to situation, no behavioral issues  Patient Active Problem List   Diagnosis Date Noted  . STEMI (ST elevation myocardial infarction) 03/17/2015  . Atrial flutter 03/17/2015  . Encounter for family conference with patient present 02/07/2015  . Stroke   . Carotid artery disease   . HLD (hyperlipidemia)   . CAD (coronary artery disease)   . Aortic stenosis   . CVA (cerebral infarction)   . Diabetes mellitus   . Hypertension   . History of atrial fibrillation   . Acute myocardial infarction of inferior wall   . Essential hypertension   . Atrial fibrillation     CBC    Component Value Date/Time   WBC 4.5 01/15/2015 0556   RBC 3.91 01/15/2015 0556   HGB 10.2* 01/15/2015 0556   HCT 32.7* 01/15/2015 0556   PLT 182 01/15/2015 0556   MCV 83.6 01/15/2015 0556   LYMPHSABS 1.4 01/12/2015 2139   MONOABS 0.7 01/12/2015 2139   EOSABS 0.2 01/12/2015 2139   BASOSABS 0.0 01/12/2015 2139    CMP     Component Value Date/Time   NA 130* 01/19/2015 0410   K 4.1 01/19/2015 0410   CL 94* 01/19/2015 0410   CO2 29 01/19/2015 0410   GLUCOSE 107* 01/19/2015 0410   BUN 13 01/19/2015 0410   CREATININE 0.76 01/19/2015 0410   CALCIUM 8.5 01/19/2015 0410   GFRNONAA 73* 01/19/2015 0410   GFRAA 84* 01/19/2015 0410    Assessment and Plan  STEMI (ST  elevation myocardial infarction) Presented and taken directly to cath lab and RCA stented, D1 culprit lesion not suitable for PTCI; LVEF - 40%  Atrial flutter Persisted;pt on cardizem  and coreg 12. Mg BID for rate;pt on eliquis and plavix  Essential hypertension Conitnue cardizem 180 mg and metoprolol which are treating rate; BP low today-will d/c lisinopril  CVA (cerebral infarction) Post cath complication 2 months ago;no problem this cath  Diabetes mellitus Continue glucophage ;A1c 2 months ago 5.9  HLD (hyperlipidemia) Continue lipitor 40 mg    Margit Hanks, MD

## 2015-03-17 NOTE — Assessment & Plan Note (Signed)
Persisted;pt on cardizem 180mg  and coreg 12. Mg BID for rate;pt on eliquis and plavix

## 2015-03-17 NOTE — Assessment & Plan Note (Signed)
Presented and taken directly to cath lab and RCA stented, D1 culprit lesion not suitable for PTCI; LVEF - 40%

## 2015-03-17 NOTE — Assessment & Plan Note (Signed)
Conitnue cardizem 180 mg and metoprolol which are treating rate; BP low today-will d/c lisinopril

## 2015-03-18 ENCOUNTER — Non-Acute Institutional Stay (SKILLED_NURSING_FACILITY): Payer: Medicare Other | Admitting: Internal Medicine

## 2015-03-18 ENCOUNTER — Encounter: Payer: Self-pay | Admitting: Internal Medicine

## 2015-03-18 DIAGNOSIS — I10 Essential (primary) hypertension: Secondary | ICD-10-CM

## 2015-03-18 DIAGNOSIS — R400 Somnolence: Secondary | ICD-10-CM

## 2015-03-18 NOTE — Progress Notes (Signed)
MRN: [id number] Name: Janet Bond  Sex: female Age: 81 y.o. DOB: 12-30-1922  [facility] Facility/Room:103FU Level Of Care: SNF Provider: Merrilee Seashore D Emergency Contacts: Extended Emergency Contact Information Primary Emergency Contact: [facility] Address: [address]          [place], [place] [address] [place] Home Phone: [phone] Mobile Phone: [phone] Relation: Grandaughter Secondary Emergency Contact: [facility] Address: [address]          [place], [place] [address] [place] Mobile Phone: [phone] Relation: Grandaughter  Code Status: FULL  Allergies: Codeine; Influenza vaccines; Aspirin; and Chicken allergy  Chief Complaint  Patient presents with  . Acute Visit    HPI: Patient is 81 y.o. female who is being seen for BP problems.  Past Medical History  Diagnosis Date  . Essential hypertension   . Type II diabetes mellitus   . Questionable History of Atrial Fibrillation     a. questionable hx of this->no afib during 12/2014 hospitalization.  . CVA (cerebral infarction)     a. 01/16/15: embolic event following an acute myocardial infarction and cardiac catheterization  . Mild Aortic Stenosis     a. mild by 2D ECHO 12/2014:  Mean gradient (S): 11 mm Hg. Peak gradient (S): 20 mm Hg. Valve area (VTI): 1.36 cm^2. Valve area (Vmax): 1.17 cm^2. Valve area (Vmean): 1.14 cm^2.  Marland Kitchen CAD (coronary artery disease)     a. 01/09/15: Acute inf STEMI/PCI: LM nl, LAD/LCX mod dzs, RPL 100 (s/p PTCA/aspiration thrombectomy).  Marland Kitchen HLD (hyperlipidemia)   . Carotid artery disease     a. carotid dopplers: 01/18/15 w/ bIlateral intimal wall thickening CCA. Moderate to severe calcific plaque origin and proximal ICA and ECA with acoustic shadowing. 1-39% ICA stenosis. Right vertebral artery flow is to-fro suggesting proximal stenosis  . H/O echocardiogram     a. 12/2014 Echo: EF 60-65%, Gr 2 DD, mild AS/AI.    Past Surgical History   Procedure Laterality Date  . Wrist fracture surgery    . Leg surgery    . Left heart catheterization with coronary angiogram N/A 01/12/2015    Procedure: LEFT HEART CATHETERIZATION WITH CORONARY ANGIOGRAM;  Surgeon: Corky Crafts, MD;  Location: [facility] CATH LAB;  Service: Cardiovascular;  Laterality: N/A;      Medication List       This list is accurate as of: 03/18/15  1:04 PM.  Always use your most recent med list.               atorvastatin 40 MG tablet  Commonly known as:  LIPITOR  Take 1 tablet (40 mg total) by mouth daily at 6 PM.     carvedilol 12.5 MG tablet  Commonly known as:  COREG  Take 1 tablet (12.5 mg total) by mouth 2 (two) times daily with a meal.     clopidogrel 75 MG tablet  Commonly known as:  PLAVIX  Take 1 tablet (75 mg total) by mouth daily with breakfast.     diltiazem 180 MG 24 hr capsule  Commonly known as:  DILACOR XR  Take 180 mg by mouth daily.     ELIQUIS 2.5 MG Tabs tablet  Generic drug:  apixaban  Take 2.5 mg by mouth 2 (two) times daily.     ferrous sulfate 325 (65 FE) MG tablet  Take 325 mg by mouth daily with breakfast.     furosemide 20 MG tablet  Commonly known as:  LASIX  Take 20 mg by mouth.     lisinopril 5 MG tablet  Commonly known as:  PRINIVIL,ZESTRIL  Take 5 mg by mouth daily.     megestrol 400 MG/10ML suspension  Commonly known as:  MEGACE  Take 400 mg by mouth daily.     metFORMIN 1000 MG tablet  Commonly known as:  GLUCOPHAGE  Take 1 tablet (1,000 mg total) by mouth 2 (two) times daily with a meal. For diabetes     nitroGLYCERIN 0.4 MG SL tablet  Commonly known as:  NITROSTAT  Place 0.4 mg under the tongue every 5 (five) minutes as needed for chest pain.     pantoprazole 40 MG tablet  Commonly known as:  PROTONIX  Take 1 tablet (40 mg total) by mouth 2 (two) times daily before a meal.     traMADol 50 MG tablet  Commonly known as:  ULTRAM  Take one tablet by mouth three times daily as needed for pain         No orders of the defined types were placed in this encounter.    Immunization History  Administered Date(s) Administered  . Influenza-Unspecified 08/06/2013  . PPD Test 11/08/2013  . Tdap 09/08/2012    History  Substance Use Topics  . Smoking status: Never Smoker   . Smokeless tobacco: Not on file  . Alcohol Use: No    Review of Systems  DATA OBTAINED: from patient, nurse;spoke with [employer] nurse over phone last pm for 30 min about same GENERAL:  no fevers, fatigue, appetite changes SKIN: No itching, rash HEENT: No complaint RESPIRATORY: No cough, wheezing, SOB CARDIAC: No chest pain, palpitations, lower extremity edema  GI: No abdominal pain, No N/V/D or constipation, No heartburn or reflux  GU: No dysuria, frequency or urgency, or incontinence  MUSCULOSKELETAL: No unrelieved bone/joint pain NEUROLOGIC: No headache, some dizziness in am PSYCHIATRIC: No overt anxiety or sadness  Filed Vitals:   03/18/15 1238  BP: 126/74  Pulse: 84  Temp: 97.8 F (36.6 C)  Resp: 18    Physical Exam  GENERAL APPEARANCE: Alert, conversant, BF,No acute distress  SKIN: No diaphoresis rash HEENT: Unremarkable RESPIRATORY: Breathing is even, unlabored. Lung sounds are clear   CARDIOVASCULAR: Heart RRR no murmurs, rubs or gallops. No peripheral edema  GASTROINTESTINAL: Abdomen is soft, non-tender, not distended w/ normal bowel sounds.  GENITOURINARY: Bladder non tender, not distended  MUSCULOSKELETAL: No abnormal joints or musculature NEUROLOGIC: Cranial nerves 2-12 grossly intact. Moves all extremities PSYCHIATRIC: mild confusion from yesterday improved, no behavioral issues  Patient Active Problem List   Diagnosis Date Noted  . STEMI (ST elevation myocardial infarction) 03/17/2015  . Atrial flutter 03/17/2015  . Encounter for family conference with patient present 02/07/2015  . Stroke   . Carotid artery disease   . HLD (hyperlipidemia)   . CAD (coronary artery disease)    . Aortic stenosis   . CVA (cerebral infarction)   . Diabetes mellitus   . Hypertension   . History of atrial fibrillation   . Acute myocardial infarction of inferior wall   . Essential hypertension   . Atrial fibrillation     CBC    Component Value Date/Time   WBC 4.5 01/15/2015 0556   RBC 3.91 01/15/2015 0556   HGB 10.2* 01/15/2015 0556   HCT 32.7* 01/15/2015 0556   PLT 182 01/15/2015 0556   MCV 83.6 01/15/2015 0556   LYMPHSABS 1.4 01/12/2015 2139   MONOABS 0.7 01/12/2015 2139   EOSABS 0.2 01/12/2015  2139   BASOSABS 0.0 01/12/2015 2139    CMP     Component Value Date/Time   NA 130* 01/19/2015 0410   K 4.1 01/19/2015 0410   CL 94* 01/19/2015 0410   CO2 29 01/19/2015 0410   GLUCOSE 107* 01/19/2015 0410   BUN 13 01/19/2015 0410   CREATININE 0.76 01/19/2015 0410   CALCIUM 8.5 01/19/2015 0410   GFRNONAA 73* 01/19/2015 0410   GFRAA 84* 01/19/2015 0410    Assessment and Plan  MS Change - pt gets very sleep with her scheduled Ultram 50 mg q8. Pt does not c/o pain . Will change Ultram to prn.  Hypertension Pt's SBP in am is 85-90 and rises tow 110-120 after breakfast but pt is c/o dizzy/lightheaded.Yesterday BP was 122/74 in evening and 110/60 in am. Pt's lisinopril has already been d/c. I would like to keep coreg at current dose since she is s/p recent MI. Her med sheets look like she was getting Dilt 120 mg at the hospital, but sent out on Dilt 180 mg. Will decrease Diltiazem back to 120 mg ER daily and keep coreg 12.5 mg BID. If BP improves but rate gets too high will revisit.    Margit [place], MD

## 2015-03-18 NOTE — Assessment & Plan Note (Addendum)
Pt's SBP in am is 85-90 and rises tow 110-120 after breakfast but pt is c/o dizzy/lightheaded.Yesterday BP was 122/74 in evening and 110/60 in am. Pt's lisinopril has already been d/c. I would like to keep coreg at current dose since she is s/p recent MI. Her med sheets look like she was getting Dilt 120 mg at the hospital, but sent out on Dilt 180 mg. Will decrease Diltiazem back to 120 mg ER daily and keep coreg 12.5 mg BID. If BP improves but rate gets too high will revisit.

## 2015-04-13 ENCOUNTER — Non-Acute Institutional Stay (SKILLED_NURSING_FACILITY): Payer: Medicare Other | Admitting: Internal Medicine

## 2015-04-13 ENCOUNTER — Encounter: Payer: Self-pay | Admitting: Internal Medicine

## 2015-04-13 DIAGNOSIS — I10 Essential (primary) hypertension: Secondary | ICD-10-CM | POA: Diagnosis not present

## 2015-04-13 DIAGNOSIS — N10 Acute tubulo-interstitial nephritis: Secondary | ICD-10-CM

## 2015-04-13 DIAGNOSIS — E119 Type 2 diabetes mellitus without complications: Secondary | ICD-10-CM

## 2015-04-13 DIAGNOSIS — I482 Chronic atrial fibrillation, unspecified: Secondary | ICD-10-CM

## 2015-04-13 DIAGNOSIS — I213 ST elevation (STEMI) myocardial infarction of unspecified site: Secondary | ICD-10-CM | POA: Diagnosis not present

## 2015-04-13 NOTE — Progress Notes (Signed)
Patient ID: Hosie Spangleaomi Economos, female   DOB: 10/10/1922, 81 y.o.   MRN: [id number] MRN: [id number] Name: Hosie Spangleaomi Keiffer  Sex: female Age: 81 y.o. DOB: 02/06/1923  [facility] Level Of Care: SNF Provider: Roena MaladyLASSEN, Ashara Lounsbury C Emergency Contacts: Extended Emergency Contact Information Primary Emergency Contact: [facility] Address: [address]          [place], [place] [address] [place] Home Phone: [phone] Mobile Phone: [phone] Relation: Grandaughter Secondary Emergency Contact: [facility] Address: [address]          [place], [place] [address] [place] Mobile Phone: [phone] Relation: Grandaughter  Code Status: FULL  Allergies: Codeine; Influenza vaccines; Aspirin; and Chicken allergy  Chief Complaint  Patient presents with  . Discharge Note    HPI: Patient is 81 y.o. female who is admitted to SNF for OT/PT afyter STEMI, [facility] Her stay here is been fairly unremarkable she still continues with a spotty appetite she is on Megace.  Dr. Lyn HollingsheadAlexander did see her for what appeared to be possibly some orthostatic hypotension dizziness-her Coreg was reduced and lisinopril discontinued apparently this has helped patient does not complain of back recently.  She will be going home apparently with family who is quite supportive she will need a rolling walker and wheelchair secondary to continued weakness as well as a bedside commode secondary to her weakness with fall risk.  Of note she did apparently complain of some dysuria recently at urine culture has come back positive for Klebsiella-it is sensitive to Cipro will start her on this for a seven-day course  Past Medical History  Diagnosis Date  . Essential hypertension   . Type II diabetes mellitus   . Questionable History of Atrial Fibrillation     a. questionable hx of this->no afib during 12/2014 hospitalization.  . CVA (cerebral infarction)     a. 01/16/15:  embolic event following an acute myocardial infarction and cardiac catheterization  . Mild Aortic Stenosis     a. mild by 2D ECHO 12/2014:  Mean gradient (S): 11 mm Hg. Peak gradient (S): 20 mm Hg. Valve area (VTI): 1.36 cm^2. Valve area (Vmax): 1.17 cm^2. Valve area (Vmean): 1.14 cm^2.  Marland Kitchen. CAD (coronary artery disease)     a. 01/09/15: Acute inf STEMI/PCI: LM nl, LAD/LCX mod dzs, RPL 100 (s/p PTCA/aspiration thrombectomy).  Marland Kitchen. HLD (hyperlipidemia)   . Carotid artery disease     a. carotid dopplers: 01/18/15 w/ bIlateral intimal wall thickening CCA. Moderate to severe calcific plaque origin and proximal ICA and ECA with acoustic shadowing. 1-39% ICA stenosis. Right vertebral artery flow is to-fro suggesting proximal stenosis  . H/O echocardiogram     a. 12/2014 Echo: EF 60-65%, Gr 2 DD, mild AS/AI.    Past Surgical History  Procedure Laterality Date  . Wrist fracture surgery    . Leg surgery    . Left heart catheterization with coronary angiogram N/A 01/12/2015    Procedure: LEFT HEART CATHETERIZATION WITH CORONARY ANGIOGRAM;  Surgeon: Corky CraftsJayadeep S Varanasi, MD;  [facility];  Service: Cardiovascular;  Laterality: N/A;      Medication List       This list is accurate as of: 04/13/15  8:02 PM.  Always use your most recent med list.               atorvastatin 40 MG tablet  Commonly known as:  LIPITOR  Take 1 tablet (40 mg total) by mouth  daily at 6 PM.     carvedilol 12.5 MG tablet  Commonly known as:  COREG  Take 1 tablet (12.5 mg total) by mouth 2 (two) times daily with a meal.     clopidogrel 75 MG tablet  Commonly known as:  PLAVIX  Take 1 tablet (75 mg total) by mouth daily with breakfast.     diltiazem 180 MG 24 hr capsule  Commonly known as:  DILACOR XR  Take 180 mg by mouth daily.     ELIQUIS 2.5 MG Tabs tablet  Generic drug:  apixaban  Take 2.5 mg by mouth 2 (two) times daily.     ferrous sulfate 325 (65 FE) MG tablet  Take 325 mg by mouth daily with  breakfast.     furosemide 20 MG tablet  Commonly known as:  LASIX  Take 20 mg by mouth.     megestrol 400 MG/10ML suspension  Commonly known as:  MEGACE  Take 400 mg by mouth daily.     metFORMIN 1000 MG tablet  Commonly known as:  GLUCOPHAGE  Take 1 tablet (1,000 mg total) by mouth 2 (two) times daily with a meal. For diabetes     nitroGLYCERIN 0.4 MG SL tablet  Commonly known as:  NITROSTAT  Place 0.4 mg under the tongue every 5 (five) minutes as needed for chest pain.     pantoprazole 40 MG tablet  Commonly known as:  PROTONIX  Take 1 tablet (40 mg total) by mouth 2 (two) times daily before a meal.     traMADol 50 MG tablet  Commonly known as:  ULTRAM  Take one tablet by mouth three times daily as needed for pain       of note her Coreg has been reduced to 6.25 mg twice a day secondary to concerns of orthostatic hypotension with dizziness.  We also have discontinued her tramadol secondary to family concerns of oversedation  No orders of the defined types were placed in this encounter.    Immunization History  Administered Date(s) Administered  . Influenza-Unspecified 08/06/2013  . PPD Test 11/08/2013  . Tdap 09/08/2012    History  Substance Use Topics  . Smoking status: Never Smoker   . Smokeless tobacco: Not on file  . Alcohol Use: No    Family history is noncontributory    Review of Systems  DATA OBTAINED: from patient, nurse-  GENERAL:  no fevers, fatigue, appetite changes SKIN: No itching, rash or wounds EYES: No eye pain, redness, discharge EARS: No earache, tinnitus, change in hearing NOSE: No congestion, drainage or bleeding  MOUTH/THROAT: No mouth or tooth pain, No sore throat RESPIRATORY: No cough, wheezing, SOB CARDIAC: No chest pain, palpitations--has baseline, lower extremity edema  GI: No abdominal pain, No N/V/D or constipation, No heartburn or reflux  GU: Apparently has complained of some dysuria at times again cultures come back  positive for UTI MUSCULOSKELETAL: No unrelieved bone/joint pain--has continued weakness largely underweight and wheelchair NEUROLOGIC: No headache, dizziness or focal weakness has occasional tremor per nursing staff she has had intermittent tremor since her arrival to facility this is most noticeable in the upper extremities PSYCHIATRIC: No overt anxiety or sadness, No behavior issue.   Filed Vitals:   04/13/15 1957  BP: 116/73  Pulse: 76  Temp: 98.9 F (37.2 C)  Resp: 18    Physical Exam  GENERAL APPEARANCE: Alert, conversant,  No acute distress.  SKIN: No diaphoresis rash HEAD: Normocephalic, atraumatic  EYES: Conjunctiva/lids clear. Pupils round,  reactive. EOMs intact.  EARS: External exam WNL, canals clear. Hearing grossly normal.  NOSE: No deformity or discharge.  MOUTH/THROAT: Lips w/o lesions  RESPIRATORY: Breathing is even, unlabored. Lung sounds are clear with shallow air entry   CARDIOVASCULAR: Heart RRR --2/6systolic murmur    no, rubs or gallops. 1+ peripheral edema.   GASTROINTESTINAL: Abdomen is soft, non-tender, not distended w/ normal bowel sounds. GENITOURINARY: Bladder non tender, not distended  MUSCULOSKELETAL: No abnormal joints or musculature has generalized weakness largely underweight and wheelchair is able to use a rolling walker NEUROLOGIC:  Cranial nerves 2-12 grossly intact. Moves all extremities has occasional  what appears to be intentional tremor upper extremities  PSYCHIATRIC: Mood and affect appropriate to situation, no behavioral issues  Patient Active Problem List   Diagnosis Date Noted  . STEMI (ST elevation myocardial infarction) 03/17/2015  . Atrial flutter 03/17/2015  . Encounter for family conference with patient present 02/07/2015  . Stroke   . Carotid artery disease   . HLD (hyperlipidemia)   . CAD (coronary artery disease)   . Aortic stenosis   . CVA (cerebral infarction)   . Diabetes mellitus   . Hypertension   . History of atrial  fibrillation   . Acute myocardial infarction of inferior wall   . Essential hypertension   . Atrial fibrillation     Labs.  03/11/2015.  Sodium 133 potassium 5 BUN 27 creatinine 1.09.    CBC    Component Value Date/Time   WBC 4.5 01/15/2015 0556   RBC 3.91 01/15/2015 0556   HGB 10.2* 01/15/2015 0556   HCT 32.7* 01/15/2015 0556   PLT 182 01/15/2015 0556   MCV 83.6 01/15/2015 0556   LYMPHSABS 1.4 01/12/2015 2139   MONOABS 0.7 01/12/2015 2139   EOSABS 0.2 01/12/2015 2139   BASOSABS 0.0 01/12/2015 2139    CMP     Component Value Date/Time   NA 130* 01/19/2015 0410   K 4.1 01/19/2015 0410   CL 94* 01/19/2015 0410   CO2 29 01/19/2015 0410   GLUCOSE 107* 01/19/2015 0410   BUN 13 01/19/2015 0410   CREATININE 0.76 01/19/2015 0410   CALCIUM 8.5 01/19/2015 0410   GFRNONAA 73* 01/19/2015 0410   GFRAA 84* 01/19/2015 0410    Assessment and Plan History of MI-she is status post stent-clinically she appears to be stable she does continue on Plavix as well as Eliquis---she is also on a statin and beta blocker.  Atrial fibrillation this appears to be controlled on Coreg and diltiazem she also again is on anticoagulation with Plavix and Eliquist  Hypertension at this point appears controlled see recent blood pressures 116/73 147/90 139/90 and do not see consistent elevations will defer follow-up to primary care provider she is on diltiazem as well as Coreg   History of CVA again she continues on anticoagulation as noted above does not appear to have significant lateralizing deficits she is weak however.  Diabetes type 2 hemoglobin A1c was 5.9 several months ago her sugars here appear to be stable will need follow-up by primary care provider as needed she is on Glucophage.  Hyperlipidemia again she does continue on Lipitor-.  Anemia-she is on iron Will update a hemoglobin was 10.2 back in April per chart review.  History poor appetite she is on Megace this appears to be a  challenge atappears she eats better with home prepared meals--that her family brings her-I suspect going home may be beneficial in this regards.  History of edema she is  on low-dose Lasix will update a metabolic panel to make sure electrolytes and renal function remained stable.  Pain management-this appears controlled however family is concerned that tramadol is over sedating her especially at night at this point will discontinue all of this will need expedient follow-up.  UTI-positive for Klebsiella we will start her on Cipro for a seven-day course she will have to complete most of this at home will write a prescription for that of course.  Again she will be going home with family and will need a rolling walker as well as wheelchair for ambulation with still significant weakness and fall risk with her numerous medical issues including recent MI-also a bedside commode secondary to her weakness and fall risk.  Will update a CBC and metabolic panel before discharge and again will start her on Cipro and discontinue her tramadol secondary to oversedation concerns.  [id number] note greater than 30 minutes spent on this discharge summary including discussing with nursing reviewing her chart --and coordinating and formulating a plan of care for numerous diagnoses-prescriptions have been written as well.     Isair Inabinet C,

## 2015-04-17 ENCOUNTER — Inpatient Hospital Stay (HOSPITAL_COMMUNITY)
Admission: EM | Admit: 2015-04-17 | Discharge: 2015-04-22 | DRG: 189 | Disposition: A | Discharge status: Skilled Nursing Facility | Payer: Medicare Other | Attending: Internal Medicine | Admitting: Internal Medicine

## 2015-04-17 DIAGNOSIS — I10 Essential (primary) hypertension: Secondary | ICD-10-CM | POA: Diagnosis present

## 2015-04-17 DIAGNOSIS — N179 Acute kidney failure, unspecified: Secondary | ICD-10-CM | POA: Diagnosis present

## 2015-04-17 DIAGNOSIS — D62 Acute posthemorrhagic anemia: Secondary | ICD-10-CM | POA: Diagnosis present

## 2015-04-17 DIAGNOSIS — I272 Other secondary pulmonary hypertension: Secondary | ICD-10-CM | POA: Diagnosis present

## 2015-04-17 DIAGNOSIS — Z886 Allergy status to analgesic agent status: Secondary | ICD-10-CM

## 2015-04-17 DIAGNOSIS — L8990 Pressure ulcer of unspecified site, unspecified stage: Secondary | ICD-10-CM | POA: Insufficient documentation

## 2015-04-17 DIAGNOSIS — I482 Chronic atrial fibrillation, unspecified: Secondary | ICD-10-CM | POA: Diagnosis present

## 2015-04-17 DIAGNOSIS — E43 Unspecified severe protein-calorie malnutrition: Secondary | ICD-10-CM | POA: Insufficient documentation

## 2015-04-17 DIAGNOSIS — E119 Type 2 diabetes mellitus without complications: Secondary | ICD-10-CM | POA: Diagnosis present

## 2015-04-17 DIAGNOSIS — Z91018 Allergy to other foods: Secondary | ICD-10-CM

## 2015-04-17 DIAGNOSIS — I4891 Unspecified atrial fibrillation: Secondary | ICD-10-CM | POA: Diagnosis not present

## 2015-04-17 DIAGNOSIS — Z66 Do not resuscitate: Secondary | ICD-10-CM | POA: Diagnosis present

## 2015-04-17 DIAGNOSIS — Z887 Allergy status to serum and vaccine status: Secondary | ICD-10-CM

## 2015-04-17 DIAGNOSIS — E440 Moderate protein-calorie malnutrition: Secondary | ICD-10-CM | POA: Diagnosis present

## 2015-04-17 DIAGNOSIS — J9621 Acute and chronic respiratory failure with hypoxia: Secondary | ICD-10-CM | POA: Diagnosis not present

## 2015-04-17 DIAGNOSIS — Z9981 Dependence on supplemental oxygen: Secondary | ICD-10-CM

## 2015-04-17 DIAGNOSIS — I248 Other forms of acute ischemic heart disease: Secondary | ICD-10-CM | POA: Diagnosis present

## 2015-04-17 DIAGNOSIS — E876 Hypokalemia: Secondary | ICD-10-CM | POA: Diagnosis present

## 2015-04-17 DIAGNOSIS — D649 Anemia, unspecified: Secondary | ICD-10-CM

## 2015-04-17 DIAGNOSIS — I5031 Acute diastolic (congestive) heart failure: Secondary | ICD-10-CM | POA: Diagnosis present

## 2015-04-17 DIAGNOSIS — Z6826 Body mass index (BMI) 26.0-26.9, adult: Secondary | ICD-10-CM

## 2015-04-17 DIAGNOSIS — Z885 Allergy status to narcotic agent status: Secondary | ICD-10-CM

## 2015-04-17 DIAGNOSIS — I2510 Atherosclerotic heart disease of native coronary artery without angina pectoris: Secondary | ICD-10-CM | POA: Diagnosis present

## 2015-04-17 DIAGNOSIS — Z9289 Personal history of other medical treatment: Secondary | ICD-10-CM

## 2015-04-17 HISTORY — DX: Cerebral infarction, unspecified: I63.9

## 2015-04-18 ENCOUNTER — Inpatient Hospital Stay (HOSPITAL_COMMUNITY): Payer: Medicare Other

## 2015-04-18 ENCOUNTER — Emergency Department (HOSPITAL_COMMUNITY): Payer: Medicare Other

## 2015-04-18 ENCOUNTER — Encounter (HOSPITAL_COMMUNITY): Payer: Self-pay

## 2015-04-18 DIAGNOSIS — Z887 Allergy status to serum and vaccine status: Secondary | ICD-10-CM | POA: Diagnosis not present

## 2015-04-18 DIAGNOSIS — Z9981 Dependence on supplemental oxygen: Secondary | ICD-10-CM | POA: Diagnosis not present

## 2015-04-18 DIAGNOSIS — I2510 Atherosclerotic heart disease of native coronary artery without angina pectoris: Secondary | ICD-10-CM | POA: Diagnosis not present

## 2015-04-18 DIAGNOSIS — E876 Hypokalemia: Secondary | ICD-10-CM | POA: Diagnosis not present

## 2015-04-18 DIAGNOSIS — K922 Gastrointestinal hemorrhage, unspecified: Secondary | ICD-10-CM | POA: Diagnosis not present

## 2015-04-18 DIAGNOSIS — J810 Acute pulmonary edema: Secondary | ICD-10-CM | POA: Diagnosis not present

## 2015-04-18 DIAGNOSIS — J9621 Acute and chronic respiratory failure with hypoxia: Secondary | ICD-10-CM | POA: Diagnosis present

## 2015-04-18 DIAGNOSIS — I248 Other forms of acute ischemic heart disease: Secondary | ICD-10-CM | POA: Diagnosis not present

## 2015-04-18 DIAGNOSIS — D62 Acute posthemorrhagic anemia: Secondary | ICD-10-CM | POA: Diagnosis not present

## 2015-04-18 DIAGNOSIS — I10 Essential (primary) hypertension: Secondary | ICD-10-CM | POA: Diagnosis not present

## 2015-04-18 DIAGNOSIS — J9601 Acute respiratory failure with hypoxia: Secondary | ICD-10-CM

## 2015-04-18 DIAGNOSIS — Z91018 Allergy to other foods: Secondary | ICD-10-CM | POA: Diagnosis not present

## 2015-04-18 DIAGNOSIS — Z6826 Body mass index (BMI) 26.0-26.9, adult: Secondary | ICD-10-CM | POA: Diagnosis not present

## 2015-04-18 DIAGNOSIS — Z886 Allergy status to analgesic agent status: Secondary | ICD-10-CM | POA: Diagnosis not present

## 2015-04-18 DIAGNOSIS — Z885 Allergy status to narcotic agent status: Secondary | ICD-10-CM | POA: Diagnosis not present

## 2015-04-18 DIAGNOSIS — I272 Other secondary pulmonary hypertension: Secondary | ICD-10-CM | POA: Diagnosis not present

## 2015-04-18 DIAGNOSIS — E440 Moderate protein-calorie malnutrition: Secondary | ICD-10-CM | POA: Diagnosis not present

## 2015-04-18 DIAGNOSIS — Z66 Do not resuscitate: Secondary | ICD-10-CM | POA: Diagnosis not present

## 2015-04-18 DIAGNOSIS — E119 Type 2 diabetes mellitus without complications: Secondary | ICD-10-CM | POA: Diagnosis not present

## 2015-04-18 DIAGNOSIS — E43 Unspecified severe protein-calorie malnutrition: Secondary | ICD-10-CM | POA: Diagnosis not present

## 2015-04-18 DIAGNOSIS — I482 Chronic atrial fibrillation, unspecified: Secondary | ICD-10-CM | POA: Diagnosis present

## 2015-04-18 DIAGNOSIS — I4891 Unspecified atrial fibrillation: Secondary | ICD-10-CM | POA: Diagnosis present

## 2015-04-18 DIAGNOSIS — N179 Acute kidney failure, unspecified: Secondary | ICD-10-CM | POA: Diagnosis not present

## 2015-04-18 DIAGNOSIS — M7989 Other specified soft tissue disorders: Secondary | ICD-10-CM

## 2015-04-18 DIAGNOSIS — I5031 Acute diastolic (congestive) heart failure: Secondary | ICD-10-CM | POA: Diagnosis not present

## 2015-04-18 DIAGNOSIS — L8990 Pressure ulcer of unspecified site, unspecified stage: Secondary | ICD-10-CM | POA: Diagnosis not present

## 2015-04-18 LAB — CBC
HCT: 22.2 % — ABNORMAL LOW (ref 36.0–46.0)
HCT: 29.7 % — ABNORMAL LOW (ref 36.0–46.0)
HEMATOCRIT: 21.6 % — AB (ref 36.0–46.0)
HEMATOCRIT: 29.7 % — AB (ref 36.0–46.0)
HEMOGLOBIN: 6.7 g/dL — AB (ref 12.0–15.0)
HEMOGLOBIN: 6.4 g/dL — AB (ref 12.0–15.0)
HEMOGLOBIN: 9.5 g/dL — AB (ref 12.0–15.0)
HEMOGLOBIN: 9.5 g/dL — AB (ref 12.0–15.0)
MCH: 25.6 pg — ABNORMAL LOW (ref 26.0–34.0)
MCH: 25.7 pg — ABNORMAL LOW (ref 26.0–34.0)
MCH: 27.2 pg (ref 26.0–34.0)
MCH: 27.1 pg (ref 26.0–34.0)
MCHC: 30.2 g/dL (ref 30.0–36.0)
MCHC: 29.6 g/dL — ABNORMAL LOW (ref 30.0–36.0)
MCHC: 32 g/dL (ref 30.0–36.0)
MCHC: 32 g/dL (ref 30.0–36.0)
MCV: 84.7 fL (ref 78.0–100.0)
MCV: 86.7 fL (ref 78.0–100.0)
MCV: 85.1 fL (ref 78.0–100.0)
MCV: 84.6 fL (ref 78.0–100.0)
PLATELETS: 272 10*3/uL (ref 150–400)
Platelets: 269 10*3/uL (ref 150–400)
Platelets: 234 10*3/uL (ref 150–400)
Platelets: 251 10*3/uL (ref 150–400)
RBC: 2.62 MIL/uL — ABNORMAL LOW (ref 3.87–5.11)
RBC: 2.49 MIL/uL — AB (ref 3.87–5.11)
RBC: 3.49 MIL/uL — AB (ref 3.87–5.11)
RBC: 3.51 MIL/uL — AB (ref 3.87–5.11)
RDW: 20.1 % — ABNORMAL HIGH (ref 11.5–15.5)
RDW: 20.3 % — ABNORMAL HIGH (ref 11.5–15.5)
RDW: 18.7 % — ABNORMAL HIGH (ref 11.5–15.5)
RDW: 18.9 % — ABNORMAL HIGH (ref 11.5–15.5)
WBC: 6.9 10*3/uL (ref 4.0–10.5)
WBC: 6.5 10*3/uL (ref 4.0–10.5)
WBC: 6 10*3/uL (ref 4.0–10.5)
WBC: 6.2 10*3/uL (ref 4.0–10.5)

## 2015-04-18 LAB — URINALYSIS, ROUTINE W REFLEX MICROSCOPIC
Bilirubin Urine: NEGATIVE
Glucose, UA: NEGATIVE mg/dL
Hgb urine dipstick: NEGATIVE
KETONES UR: NEGATIVE mg/dL
NITRITE: NEGATIVE
PROTEIN: 30 mg/dL — AB
SPECIFIC GRAVITY, URINE: 1.019 (ref 1.005–1.030)
Urobilinogen, UA: 2 mg/dL — ABNORMAL HIGH (ref 0.0–1.0)
pH: 6.5 (ref 5.0–8.0)

## 2015-04-18 LAB — PROTIME-INR
INR: 1.5 — AB (ref 0.00–1.49)
PROTHROMBIN TIME: 18.2 s — AB (ref 11.6–15.2)

## 2015-04-18 LAB — BASIC METABOLIC PANEL
Anion gap: 10 (ref 5–15)
BUN: 30 mg/dL — AB (ref 6–20)
CO2: 23 mmol/L (ref 22–32)
Calcium: 8.9 mg/dL (ref 8.9–10.3)
Chloride: 106 mmol/L (ref 101–111)
Creatinine, Ser: 1.45 mg/dL — ABNORMAL HIGH (ref 0.44–1.00)
GFR, EST AFRICAN AMERICAN: 36 mL/min — AB (ref >60)
GFR, EST NON AFRICAN AMERICAN: 31 mL/min — AB (ref >60)
Glucose, Bld: 129 mg/dL — ABNORMAL HIGH (ref 65–99)
Potassium: 4.9 mmol/L (ref 3.5–5.1)
Sodium: 139 mmol/L (ref 135–145)

## 2015-04-18 LAB — HEPATIC FUNCTION PANEL
ALT: 70 U/L — AB (ref 14–54)
AST: 104 U/L — AB (ref 15–41)
Albumin: 2.8 g/dL — ABNORMAL LOW (ref 3.5–5.0)
Alkaline Phosphatase: 72 U/L (ref 38–126)
BILIRUBIN DIRECT: 0.3 mg/dL (ref 0.1–0.5)
Indirect Bilirubin: 0.7 mg/dL (ref 0.3–0.9)
Total Bilirubin: 1 mg/dL (ref 0.3–1.2)
Total Protein: 6.2 g/dL — ABNORMAL LOW (ref 6.5–8.1)

## 2015-04-18 LAB — TROPONIN I
Troponin I: 0.03 ng/mL (ref <0.031)
Troponin I: 0.04 ng/mL — ABNORMAL HIGH (ref <0.031)
Troponin I: 0.04 ng/mL — ABNORMAL HIGH (ref <0.031)
Troponin I: 0.04 ng/mL — ABNORMAL HIGH (ref <0.031)

## 2015-04-18 LAB — MRSA PCR SCREENING: MRSA BY PCR: NEGATIVE

## 2015-04-18 LAB — APTT: APTT: 31 s (ref 24–37)

## 2015-04-18 LAB — URINE MICROSCOPIC-ADD ON

## 2015-04-18 LAB — MAGNESIUM: Magnesium: 2.2 mg/dL (ref 1.7–2.4)

## 2015-04-18 LAB — PREPARE RBC (CROSSMATCH)

## 2015-04-18 LAB — BRAIN NATRIURETIC PEPTIDE: B NATRIURETIC PEPTIDE 5: 1250.5 pg/mL — AB (ref 0.0–100.0)

## 2015-04-18 LAB — POC OCCULT BLOOD, ED: Fecal Occult Bld: POSITIVE — AB

## 2015-04-18 LAB — D-DIMER, QUANTITATIVE: D-Dimer, Quant: 2.01 ug/mL-FEU — ABNORMAL HIGH (ref 0.00–0.48)

## 2015-04-18 LAB — LACTIC ACID, PLASMA: Lactic Acid, Venous: 1.5 mmol/L (ref 0.5–2.0)

## 2015-04-18 LAB — PHOSPHORUS: Phosphorus: 4.1 mg/dL (ref 2.5–4.6)

## 2015-04-18 LAB — ABO/RH: ABO/RH(D): O POS

## 2015-04-18 MED ORDER — SENNA 8.6 MG PO TABS
1.0000 | ORAL_TABLET | Freq: Every day | ORAL | Status: DC
  Administered 2015-04-18 – 2015-04-21 (×3): 8.6 mg via ORAL
  Filled 2015-04-18 (×6): qty 1

## 2015-04-18 MED ORDER — SODIUM CHLORIDE 0.9 % IV BOLUS (SEPSIS)
500.0000 mL | Freq: Once | INTRAVENOUS | Status: AC
  Administered 2015-04-18: 500 mL via INTRAVENOUS

## 2015-04-18 MED ORDER — CHLORHEXIDINE GLUCONATE 0.12 % MT SOLN
15.0000 mL | Freq: Two times a day (BID) | OROMUCOSAL | Status: DC
  Administered 2015-04-18 – 2015-04-22 (×9): 15 mL via OROMUCOSAL
  Filled 2015-04-18 (×11): qty 15

## 2015-04-18 MED ORDER — POLYETHYLENE GLYCOL 3350 17 G PO PACK
17.0000 g | PACK | Freq: Every day | ORAL | Status: DC
  Administered 2015-04-19 – 2015-04-22 (×3): 17 g via ORAL
  Filled 2015-04-18 (×5): qty 1

## 2015-04-18 MED ORDER — CETYLPYRIDINIUM CHLORIDE 0.05 % MT LIQD
7.0000 mL | Freq: Two times a day (BID) | OROMUCOSAL | Status: DC
  Administered 2015-04-18 – 2015-04-22 (×9): 7 mL via OROMUCOSAL

## 2015-04-18 MED ORDER — SODIUM CHLORIDE 0.9 % IV SOLN: 250.0000 mL | INTRAVENOUS | Status: DC | PRN

## 2015-04-18 MED ORDER — METOPROLOL TARTRATE 1 MG/ML IV SOLN
2.5000 mg | INTRAVENOUS | Status: DC | PRN
  Administered 2015-04-18 – 2015-04-19 (×3): 5 mg via INTRAVENOUS
  Filled 2015-04-18 (×4): qty 5

## 2015-04-18 MED ORDER — TRAMADOL HCL 50 MG PO TABS
50.0000 mg | ORAL_TABLET | Freq: Two times a day (BID) | ORAL | Status: DC | PRN
  Administered 2015-04-21: 50 mg via ORAL
  Filled 2015-04-18: qty 1

## 2015-04-18 MED ORDER — ATORVASTATIN CALCIUM 40 MG PO TABS
40.0000 mg | ORAL_TABLET | Freq: Every day | ORAL | Status: DC
Start: 2015-04-18 — End: 2015-04-22
  Administered 2015-04-18 – 2015-04-21 (×4): 40 mg via ORAL
  Filled 2015-04-18 (×5): qty 1

## 2015-04-18 MED ORDER — DILTIAZEM HCL 60 MG PO TABS
60.0000 mg | ORAL_TABLET | Freq: Four times a day (QID) | ORAL | Status: DC
  Administered 2015-04-18 – 2015-04-22 (×17): 60 mg via ORAL
  Filled 2015-04-18 (×20): qty 1

## 2015-04-18 MED ORDER — DILTIAZEM HCL 25 MG/5ML IV SOLN
10.0000 mg | Freq: Once | INTRAVENOUS | Status: AC
  Administered 2015-04-18: 10 mg via INTRAVENOUS

## 2015-04-18 MED ORDER — FERROUS SULFATE 325 (65 FE) MG PO TABS
325.0000 mg | ORAL_TABLET | Freq: Every day | ORAL | Status: DC
Start: 2015-04-18 — End: 2015-04-22
  Administered 2015-04-18 – 2015-04-22 (×5): 325 mg via ORAL
  Filled 2015-04-18 (×7): qty 1

## 2015-04-18 MED ORDER — DOCUSATE SODIUM 100 MG PO CAPS
100.0000 mg | ORAL_CAPSULE | Freq: Two times a day (BID) | ORAL | Status: DC
  Administered 2015-04-18 – 2015-04-21 (×7): 100 mg via ORAL
  Filled 2015-04-18 (×10): qty 1

## 2015-04-18 MED ORDER — DILTIAZEM HCL 100 MG IV SOLR
5.0000 mg/h | INTRAVENOUS | Status: DC
  Administered 2015-04-18: 5 mg/h via INTRAVENOUS

## 2015-04-18 MED ORDER — FUROSEMIDE 10 MG/ML IJ SOLN
20.0000 mg | Freq: Once | INTRAMUSCULAR | Status: AC
  Administered 2015-04-18: 20 mg via INTRAVENOUS
  Filled 2015-04-18: qty 2

## 2015-04-18 MED ORDER — SODIUM CHLORIDE 0.9 % IV SOLN
10.0000 mL/h | Freq: Once | INTRAVENOUS | Status: AC
  Administered 2015-04-18: 10 mL/h via INTRAVENOUS

## 2015-04-18 MED ORDER — CARVEDILOL 6.25 MG PO TABS
6.2500 mg | ORAL_TABLET | Freq: Two times a day (BID) | ORAL | Status: DC
  Administered 2015-04-18 – 2015-04-22 (×9): 6.25 mg via ORAL
  Filled 2015-04-18 (×13): qty 1

## 2015-04-18 MED ORDER — FUROSEMIDE 10 MG/ML IJ SOLN
40.0000 mg | Freq: Three times a day (TID) | INTRAMUSCULAR | Status: AC
  Administered 2015-04-18 (×2): 40 mg via INTRAVENOUS
  Filled 2015-04-18 (×2): qty 4

## 2015-04-18 MED ORDER — PANTOPRAZOLE SODIUM 40 MG IV SOLR
40.0000 mg | Freq: Two times a day (BID) | INTRAVENOUS | Status: DC
  Administered 2015-04-18 – 2015-04-22 (×9): 40 mg via INTRAVENOUS
  Filled 2015-04-18 (×11): qty 40

## 2015-04-18 MED ORDER — MEGESTROL ACETATE 400 MG/10ML PO SUSP
400.0000 mg | Freq: Every day | ORAL | Status: DC
  Administered 2015-04-18 – 2015-04-22 (×5): 400 mg via ORAL
  Filled 2015-04-18 (×5): qty 10

## 2015-04-18 NOTE — Progress Notes (Signed)
PULMONARY / CRITICAL CARE MEDICINE   Name: Janet Bond MRN: [id number] DOB: 1923-05-25    ADMISSION DATE:  04/17/2015 CONSULTATION DATE:  04/18/2015  REFERRING MD :  Dr. Debara Pickett (ED)  CHIEF COMPLAINT:  Afib RVR, anemia  HISTORY OF PRESENT ILLNESS:  88F with h/o DM, HFpEF, Afib (on clopidogrel and apixaban) , CAD, pulmonary hypertension and chronic hypoxic respiratory failure requiring 2-3L at home.  She was recently d/ced from SNF (7/20) and was told on d/c that she has pneumonia that they would treat as an outpt.  Upon arrival home she remained persistently SOB with DOE and orthopnea.  Yesterday she devoloped some significant palpitation that led her to come to the ED, where she was found to be in Afib with RVR, hypertensive with a new anemia from April (10.2->6.7).  She reports dark Tarry stool at home but her granddaughter stated her last BM at home was normal colored.  She also reports increased LE edema compared to usual.  SUBJECTIVE: No events overnight, remains on BiPAP.  VITAL SIGNS: Temp:  [97.9 F (36.6 C)-98.3 F (36.8 C)] 98.2 F (36.8 C) (07/23 0900) Pulse Rate:  [92-150] 97 (07/23 0900) Resp:  [18-59] 22 (07/23 0900) BP: (138-174)/(80-114) 138/108 mmHg (07/23 0900) SpO2:  [100 %] 100 % (07/23 0900) Weight:  [60.7 kg (133 lb 13.1 oz)] 60.7 kg (133 lb 13.1 oz) (07/23 0446)    VENTILATOR SETTINGS: Bipap 10/5 40%   INTAKE / OUTPUT:  Intake/Output Summary (Last 24 hours) at 04/18/15 0958 Last data filed at 04/18/15 0900  Gross per 24 hour  Intake 933.25 ml  Output    600 ml  Net 333.25 ml    PHYSICAL EXAMINATION: General:  Mild respiratory distress Neuro:  AAOx3, CN II-XII intact HEENT:  Poor dentition. + JVD to 10cm, bipap mask in place. Cardiovascular:  Irreg rate/rhythm. No m/r/g Lungs:  Minimal LLL rales. No wheezing Abdomen:  Soft, non tender with palpable stool Skin:  No rashes LE: 2-3+ pitting edema b/l to knee DRE: good tone, + liquid and impacted stool  in vault.  No obvious melena or blood.   LABS:  CBC  Recent Labs Lab 04/18/15 0103 04/18/15 0355  WBC 6.9 6.5  HGB 6.7* 6.4*  HCT 22.2* 21.6*  PLT 272 269   Coag's  Recent Labs Lab 04/18/15 0103 04/18/15 0355  APTT  --  31  INR 1.50*  --    BMET  Recent Labs Lab 04/18/15 0103  NA 139  K 4.9  CL 106  CO2 23  BUN 30*  CREATININE 1.45*  GLUCOSE 129*   Electrolytes  Recent Labs Lab 04/18/15 0103 04/18/15 0355  CALCIUM 8.9  --   MG  --  2.2  PHOS  --  4.1   Sepsis Markers  Recent Labs Lab 04/18/15 0355  LATICACIDVEN 1.5   ABG No results for input(s): PHART, PCO2ART, PO2ART in the last 168 hours. Liver Enzymes  Recent Labs Lab 04/18/15 0355  AST 104*  ALT 70*  ALKPHOS 72  BILITOT 1.0  ALBUMIN 2.8*   Cardiac Enzymes  Recent Labs Lab 04/18/15 0103 04/18/15 0355  TROPONINI 0.03 0.04*   Glucose No results for input(s): GLUCAP in the last 168 hours.  Imaging Dg Chest 2 View  04/18/2015   CLINICAL DATA:  81 year old female with shortness of breath  EXAM: CHEST  2 VIEW  COMPARISON:  Radiograph dated 03/10/2015  FINDINGS: There has been interval improvement of the of pulmonary opacities. A focal residual opacity  is noted in the right mid lung field. Right hilar lymph node/granuloma. Small bilateral pleural effusions. Stable cardiomegaly. Degenerative changes of the spine. No acute fracture.  IMPRESSION: Interval significant improvement in previously seen diffuse airspace opacities. Small residual focal opacity in the right mid lung field.  Small bilateral pleural effusions.   Electronically Signed   By: Elgie Collard M.D.   On: 04/18/2015 01:48   ASSESSMENT / PLAN: 38 F admitted with afib with RVR in the setting of volume overload/decompensated HFpEF with increased WOB.  Found to have new acute drop in HB, may reflect slow GIB  PULMONARY A: Acute on chronic hypoxemic respiratory failure.  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  At this time she is controlled on bipap. P:   - Diuresis with lasix 40 mg IV q8 x2 doses. - Cont bipap but change to PRN - Transfuse 1U PRBC - Rate control Afib as noted below - No emergent indication for intubation. - RML infiltrate noted. - Pulmonary nodule, will work that up after acute phase is over but doubtful that patient will be a candidate for much.  CARDIOVASCULAR A: Afib with RVR likely 2/2 volume overload and decompensated CHF with component of anemia. BNP1200. CHADS2 >3 however with acute hemaglobin drop on dual anticoag therapy. P:  - Diuresis as above - Transfusion as above - Cont home carvedilol - Diltiazem  IVP x1 and gtt - Resume home oral dilt after diuresis - Trop mildly positive. - Holding Plavix and apixiban for now.given hb drop, will re-evaluate when more stable. - HTN - diltiazem gtt goal SBP <160 and <90 DBP - PO dilt 60 mg PO q6 with holding parameters.  RENAL A:  AKI Scr 1.45 from 1.0 last admission.  Likely cardiorenal in the setting of decompensated CHF. P:   - Foley - Diuresis as above - BMET in AM. - Replace electrolytes as indicated.  GASTROINTESTINAL A:  Hb drop with FOBT + concern for GIB.  DRE negative for blood.  Unclear precipitant other than dual anti-coag therapy.  INR is 1.5 P:   - Hold anti-coag. - CBC in AM. - Protonix IV BID - No h/o cirrhosis or varicies to warrant CTX or octreotide gtt - Transfusion if <7  HEMATOLOGIC A:  Acute anemia.  Unclear etiology, suspect slow GIB vs anemia of chronic disease.  No back pain to suggest P:  -CBC q6hrs - Tx of GIB as above - Transfuse if Hb <7 - Once more stable will consider a GI consult as respiratory status precludes any procedures for now and reportedly stool has gone back to normal so likely bleeding has subsided.  Patient was previously DNR, now full code, will need to discuss further when grand daughter is available.   Attempted to discuss this with patient but she would like to discuss it with her grand daughter first.  Full code for now.  The patient is critically ill with multiple organ systems failure and requires high complexity decision making for assessment and support, frequent evaluation and titration of therapies, application of advanced monitoring technologies and extensive interpretation of multiple databases.   Critical Care Time devoted to patient care services described in this note is  35  Minutes. This time reflects time of care of this signee Dr Koren Bound. This critical care time does not reflect procedure time, or teaching time or supervisory time of PA/NP/Med student/Med Resident etc but could involve care discussion time.  Wesam G.  Molli Knock, M.D. [facility] Pulmonary/Critical Care Medicine. Pager: [phone]. After hours pager: [phone].  04/18/2015, 9:58 AM

## 2015-04-18 NOTE — ED Notes (Signed)
Dr Ankit at bedside. 

## 2015-04-18 NOTE — H&P (Addendum)
PULMONARY / CRITICAL CARE MEDICINE   Name: Janet Bond MRN: [id number] DOB: [birth year]    ADMISSION DATE:  04/17/2015 CONSULTATION DATE:  04/18/2015  REFERRING MD :  Dr. Debara Pickett (ED)  CHIEF COMPLAINT:  Afib RVR, anemia  HISTORY OF PRESENT ILLNESS:  40F with h/o DM, HFpEF, Afib (on clopidogrel and apixaban) , CAD, pulmonary hypertension and chronic hypoxic respiratory failure requiring 2-3L at home.  She was recently d/ced from SNF (7/[facility]) and was told on d/c that she has pneumonia that they would treat as an outpt.  Upon arrival home she remained persistently SOB with DOE and orthopnea.  Yesterday she devoloped some significant palpitation that led her to come to the ED, where she was found to be in Afib with RVR, hypertensive with a new anemia from April (10.2->6.7).  She reports dark Tarry stool at home but her granddaughter stated her last BM at home was normal colored.  She also reports increased LE edema compared to usual.  PAST MEDICAL HISTORY :   has a past medical history of Essential hypertension; Type II diabetes mellitus; Questionable History of Atrial Fibrillation; CVA (cerebral infarction); Mild Aortic Stenosis; CAD (coronary artery disease); HLD (hyperlipidemia); Carotid artery disease; H/O echocardiogram; and Stroke.  has past surgical history that includes Wrist fracture surgery; Leg Surgery; and left heart catheterization with coronary angiogram (N/A, 01/12/2015). Prior to Admission medications   Medication Sig Start Date End Date Taking? Authorizing Provider  apixaban (ELIQUIS) 2.5 MG TABS tablet Take 2.5 mg by mouth 2 (two) times daily.    Historical Provider, MD  atorvastatin (LIPITOR) 40 MG tablet Take 1 tablet (40 mg total) by mouth daily at 6 PM. 01/19/15   Janetta Hora, PA-C  carvedilol (COREG) 12.5 MG tablet Take 1 tablet (12.5 mg total) by mouth 2 (two) times daily with a meal. Patient taking differently: Take 6.25 mg by mouth 2 (two) times daily with a meal.  01/19/15    Janetta Hora, PA-C  clopidogrel (PLAVIX) 75 MG tablet Take 1 tablet (75 mg total) by mouth daily with breakfast. 01/19/15   Janetta Hora, PA-C  diltiazem (DILACOR XR) 180 MG 24 hr capsule Take 180 mg by mouth daily.    Historical Provider, MD  ferrous sulfate 325 (65 FE) MG tablet Take 325 mg by mouth daily with breakfast.    Historical Provider, MD  furosemide (LASIX) 20 MG tablet Take 20 mg by mouth.    Historical Provider, MD  megestrol (MEGACE) 400 MG/10ML suspension Take 400 mg by mouth daily.    Historical Provider, MD  metFORMIN (GLUCOPHAGE) 1000 MG tablet Take 1 tablet (1,000 mg total) by mouth 2 (two) times daily with a meal. For diabetes Patient taking differently: Take 500 mg by mouth 2 (two) times daily with a meal. For diabetes 01/19/15   Janetta Hora, PA-C  nitroGLYCERIN (NITROSTAT) 0.4 MG SL tablet Place 0.4 mg under the tongue every 5 (five) minutes as needed for chest pain.    Historical Provider, MD  pantoprazole (PROTONIX) 40 MG tablet Take 1 tablet (40 mg total) by mouth 2 (two) times daily before a meal. 01/19/15   Janetta Hora, PA-C  traMADol Janean Sark) 50 MG tablet Take one tablet by mouth three times daily as needed for pain 11/11/13   Tiffany L Reed, DO   Allergies  Allergen Reactions  . Codeine Nausea And Vomiting  . Influenza Vaccines Nausea And Vomiting  . Aspirin Nausea And Vomiting  . Chicken Allergy Nausea And  Vomiting    FAMILY HISTORY:  indicated that her mother is deceased. She indicated that her father is deceased.  SOCIAL HISTORY:  reports that she has never smoked. She does not have any smokeless tobacco history on file. She reports that she does not drink alcohol.  REVIEW OF SYSTEMS:   No f/c/n/v/d. No hemoptysis or hematemesis.  No confusion, weakness, dizzyness, no abdominal pain.    + orthopnea, constipation, PND, SOB, DOE. + increased LE edema.  SUBJECTIVE:   VITAL SIGNS: Temp:  [98.3 F (36.8 C)] 98.3 F (36.8 C) (07/23  0039) Pulse Rate:  [109-150] 145 (07/23 0334) Resp:  [21-59] 36 (07/23 0334) BP: (155-164)/(87-114) 155/110 mmHg (07/23 0334) SpO2:  [100 %] 100 % (07/23 0334)    VENTILATOR SETTINGS: Bipap 10/5 40%   INTAKE / OUTPUT:  Intake/Output Summary (Last 24 hours) at 04/18/15 0351 Last data filed at 04/18/15 0207  Gross per 24 hour  Intake    500 ml  Output      0 ml  Net    500 ml    PHYSICAL EXAMINATION: General:  Mild respiratory distress Neuro:  AAOx3, CN II-XII intact HEENT:  Poor dentition. + JVD to 10cm, bipap mask in place. Cardiovascular:  Irreg rate/rhythm. No m/r/g Lungs:  Minimal LLL rales. No wheezing Abdomen:  Soft, non tender with palpable stool Skin:  No rashes LE: 2-3+ pitting edema b/l to knee DRE: good tone, + liquid and impacted stool in vault.  No obvious melena or blood.   LABS:  CBC  Recent Labs Lab 04/18/15 0103  WBC 6.9  HGB 6.7*  HCT 22.2*  PLT 272   Coag's  Recent Labs Lab 04/18/15 0103  INR 1.50*   BMET  Recent Labs Lab 04/18/15 0103  NA 139  K 4.9  CL 106  CO2 23  BUN 30*  CREATININE 1.45*  GLUCOSE 129*   Electrolytes  Recent Labs Lab 04/18/15 0103  CALCIUM 8.9   Sepsis Markers No results for input(s): LATICACIDVEN, PROCALCITON, O2SATVEN in the last 168 hours. ABG No results for input(s): PHART, PCO2ART, PO2ART in the last 168 hours. Liver Enzymes No results for input(s): AST, ALT, ALKPHOS, BILITOT, ALBUMIN in the last 168 hours. Cardiac Enzymes  Recent Labs Lab 04/18/15 0103  TROPONINI 0.03   Glucose No results for input(s): GLUCAP in the last 168 hours.  Imaging Dg Chest 2 View  04/18/2015   CLINICAL DATA:  81 year old female with shortness of breath  EXAM: CHEST  2 VIEW  COMPARISON:  Radiograph dated 03/10/2015  FINDINGS: There has been interval improvement of the of pulmonary opacities. A focal residual opacity is noted in the right mid lung field. Right hilar lymph node/granuloma. Small bilateral pleural  effusions. Stable cardiomegaly. Degenerative changes of the spine. No acute fracture.  IMPRESSION: Interval significant improvement in previously seen diffuse airspace opacities. Small residual focal opacity in the right mid lung field.  Small bilateral pleural effusions.   Electronically Signed   By: Elgie Collard M.D.   On: 04/18/2015 01:48     ASSESSMENT / PLAN: 55 F admitted with afib with RVR in the setting of volume overload/decompensated HFpEF with increased WOB.  Found to have new acute drop in HB, may reflect slow GIB  PULMONARY A: Acute on chronic hypoxemic respiratory failure.  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  At this time she is controlled on bipap. P:   - diuresis 1-2L tonight as BP and Scr  tolerates - cont bipap - ABG - transfuse 1U PRBC - rate control Afib as noted below - no emergent indication for intubation. - CXR appears more interstitial infiltrative pattern suggesting CHF  - Pulmonary nodule- on CXR.  Non-con CT chest when pt has been diuresed and can lay flat.  I briefly discussed with her daughter that if this is a mass the next step would likely be bronchoscopy and biposy, which would be rather high risk for her given her age, O2 requirements and multiple comorbidities and with these issues she would also not likely be a good candidate for chemo if this was malignant.  Radiation would be a possiblity depending on stage.  They will discuss further and this should be revisited.  CARDIOVASCULAR A: Afib with RVR likely 2/2 volume overload and decompensated CHF with component of anemia. BNP1200. CHADS2 >3 however with acute hemaglobin drop on dual anticoag therapy. P:  - diuresis as above - transfusion as above - cont home carvedilol - diltiazem 10mg  IVP x1 and gtt - resume home oral dilt after diuresis -trop negative - holding plavex and apixiban for now.given hb drop - HTN - diltiazem gtt goal  SBP <160 and <90 DBP  RENAL A:  AKI Scr 1.45 from 1.0 last admission.  Likely cardiorenal in the setting of decompensated CHF. P:   - place foley - diuresis as above - recheck after diuresis   GASTROINTESTINAL A:  Hb drop with FOBT + concern for GIB.  DRE negative for blood.  Unclear precipitant other than dual anti-coag therapy.  INR is 1.5 P:   - hold anti-coag - CBC q6hours - protonix IV BID - no h/o cirrhosis or varicies to warrant CTX or octreotide gtt - transfusion if <7  HEMATOLOGIC A:  Acute anemia.  Unclear etiology, suspect slow GIB vs anemia of chronic disease.  No back pain to suggest P:  -CBC q6hrs - tx of GIB as above - check retic count - Transfuse if Hb <7   FAMILY  - Updates: Granddaughter updated at bedside.  #) Full code/full tube.  This will be re-addressed tomorrow as the patient was previously DNR/DNI but now is unsure, so she would like to be full code tonight and discuss with family tomorrow.  Galvin Proffer Pulmonary and Critical Care Medicine [facility] Pager: [phone]  04/18/2015, 3:51 AM   ADDENDUM This section of the documentation is late and is for billing and coding only.  Total critical care time: 45 min  Critical care time was exclusive of separately billable procedures and treating other patients.  Critical care was necessary to treat or prevent imminent or life-threatening deterioration.  Critical care was time spent personally by me on the following activities: development of treatment plan with patient and/or surrogate as well as nursing, discussions with consultants, evaluation of patient's response to treatment, examination of patient, obtaining history from patient or surrogate, ordering and performing treatments and interventions, ordering and review of laboratory studies, ordering and review of radiographic studies, pulse oximetry and re-evaluation of patient's condition.   Galvin Proffer, DO., MS [place]  Pulmonary and Critical Care Medicine

## 2015-04-18 NOTE — ED Notes (Signed)
Patient transported to CT 

## 2015-04-18 NOTE — ED Provider Notes (Signed)
CSN: [id number]     Arrival date & time 04/17/15  2358 History  This chart was scribed for Janet Kaplan, MD by Freida Busman, ED Scribe. This patient was seen in room D34C/D34C and the patient's care was started 12:23 AM.   Chief Complaint  Patient presents with  . Shortness of Breath    The history is provided by the patient. No language interpreter was used.     HPI Comments:  Janet Bond is a 81 y.o. female who presents to the Emergency Department complaining of progressively worsening SOB today. Pt is on 2L home O2. She denies h/o COPD, reports history of asthma "a long time ago" . She denies CP and lightheadedness . Pt states she was recently evaluated for chest heaviness , states she has had the SOB since. Pt denies use of blood thinners. No alleviating factors noted.  PCP: [facility]  Past Medical History  Diagnosis Date  . Essential hypertension   . Type II diabetes mellitus   . Questionable History of Atrial Fibrillation     a. questionable hx of this->no afib during 12/2014 hospitalization.  . CVA (cerebral infarction)     a. 01/16/15: embolic event following an acute myocardial infarction and cardiac catheterization  . Mild Aortic Stenosis     a. mild by 2D ECHO 12/2014:  Mean gradient (S): 11 mm Hg. Peak gradient (S): 20 mm Hg. Valve area (VTI): 1.36 cm^2. Valve area (Vmax): 1.17 cm^2. Valve area (Vmean): 1.14 cm^2.  Marland Kitchen CAD (coronary artery disease)     a. 01/09/15: Acute inf STEMI/PCI: LM nl, LAD/LCX mod dzs, RPL 100 (s/p PTCA/aspiration thrombectomy).  Marland Kitchen HLD (hyperlipidemia)   . Carotid artery disease     a. carotid dopplers: 01/18/15 w/ bIlateral intimal wall thickening CCA. Moderate to severe calcific plaque origin and proximal ICA and ECA with acoustic shadowing. 1-39% ICA stenosis. Right vertebral artery flow is to-fro suggesting proximal stenosis  . H/O echocardiogram     a. 12/2014 Echo: EF 60-65%, Gr 2 DD, mild AS/AI.  Marland Kitchen Stroke    Past Surgical History   Procedure Laterality Date  . Wrist fracture surgery    . Leg surgery    . Left heart catheterization with coronary angiogram N/A 01/12/2015    Procedure: LEFT HEART CATHETERIZATION WITH CORONARY ANGIOGRAM;  Surgeon: Corky Crafts, MD;  Location: [facility] CATH LAB;  Service: Cardiovascular;  Laterality: N/A;   Family History  Problem Relation Age of Onset  . Stroke Father   . Hypertension Father   . Stroke Mother   . Hypertension Mother    History  Substance Use Topics  . Smoking status: Never Smoker   . Smokeless tobacco: Not on file  . Alcohol Use: No   OB History    No data available     Review of Systems  Constitutional: Positive for activity change.  HENT: Negative for facial swelling.   Respiratory: Positive for shortness of breath. Negative for cough and wheezing.   Cardiovascular: Negative for chest pain.  Gastrointestinal: Negative for nausea, vomiting, abdominal pain, diarrhea, constipation, blood in stool and abdominal distention.  Genitourinary: Negative for hematuria and difficulty urinating.  Musculoskeletal: Positive for gait problem. Negative for neck pain.  Skin: Negative for color change.  Allergic/Immunologic: Negative for immunocompromised state.  Neurological: Positive for weakness and light-headedness. Negative for speech difficulty.  Hematological: Bruises/bleeds easily.  Psychiatric/Behavioral: Negative for confusion.    A complete 10 system review of systems was obtained and all systems are  negative except as noted in the HPI and PMH.    Allergies  Codeine; Influenza vaccines; Aspirin; and Chicken allergy  Home Medications   Prior to Admission medications   Medication Sig Start Date End Date Taking? Authorizing Provider  ergocalciferol (VITAMIN D2) 50000 UNITS capsule Take 50,000 Units by mouth once a week.   Yes Historical Provider, MD  furosemide (LASIX) 20 MG tablet Take 20 mg by mouth 2 (two) times daily. 04/14/15 04/13/16 Yes Historical  Provider, MD  megestrol (MEGACE) 400 MG/10ML suspension Take 10 mLs by mouth daily. 02/24/15  Yes Historical Provider, MD  Multiple Vitamin (MULTIVITAMIN) tablet Take 1 tablet by mouth daily.   Yes Historical Provider, MD  Polyvinyl Alcohol-Povidone (REFRESH OP) Apply 1 drop to eye 4 (four) times daily as needed (dry eyes).   Yes Historical Provider, MD  amLODipine (NORVASC) 5 MG tablet Take 5 mg by mouth daily. 01/20/15   Historical Provider, MD  atorvastatin (LIPITOR) 40 MG tablet Take 1 tablet (40 mg total) by mouth daily at 6 PM. 01/19/15   Janetta Hora, PA-C  carvedilol (COREG) 12.5 MG tablet Take 1 tablet (12.5 mg total) by mouth 2 (two) times daily with a meal. Patient taking differently: Take 6.25 mg by mouth 2 (two) times daily with a meal.  01/19/15   Janetta Hora, PA-C  diltiazem (CARDIZEM) 60 MG tablet Take 1 tablet (60 mg total) by mouth every 6 (six) hours. 04/22/15   Maryann Mikhail, DO  feeding supplement, ENSURE ENLIVE, (ENSURE ENLIVE) LIQD Take 237 mLs by mouth 2 (two) times daily between meals. 04/22/15   Maryann Mikhail, DO  ferrous sulfate 325 (65 FE) MG tablet Take 325 mg by mouth daily with breakfast.    Historical Provider, MD  menthol-cetylpyridinium (CEPACOL) 3 MG lozenge Take 1 lozenge (3 mg total) by mouth as needed for sore throat. 04/22/15   Maryann Mikhail, DO  nitroGLYCERIN (NITROSTAT) 0.4 MG SL tablet Place 0.4 mg under the tongue every 5 (five) minutes as needed for chest pain.    Historical Provider, MD  pantoprazole (PROTONIX) 40 MG tablet Take 1 tablet (40 mg total) by mouth 2 (two) times daily before a meal. 01/19/15   Janetta Hora, PA-C  senna-docusate (SENOKOT-S) 8.6-50 MG per tablet Take 1 tablet by mouth 2 (two) times daily. 04/22/15   Maryann Mikhail, DO  traMADol (ULTRAM) 50 MG tablet Take one tablet by mouth three times daily as needed for pain 04/22/15   Kimber Relic, MD   BP 120/58 mmHg  Pulse 93  Temp(Src) 98.2 F (36.8 C) (Axillary)   Resp 18  Ht 5' (1.524 m)  Wt 137 lb 9.1 oz (62.4 kg)  BMI 26.87 kg/m2  SpO2 100% Physical Exam  Constitutional: She is oriented to person, place, and time. She appears well-developed and well-nourished. No distress.  HENT:  Head: Normocephalic and atraumatic.  Eyes: Conjunctivae are normal.  Neck: Normal range of motion. JVD present.  Cardiovascular: Normal rate.   Murmur (Systolic) heard. Irregularly irregular tachy Systolic murmur   Pulmonary/Chest: She is in respiratory distress. She has rales (Mild left sided crackles).  Respiratory rate 24-26  Abdominal: She exhibits distension. She exhibits no mass (No palpable mass).  Musculoskeletal: Normal range of motion. She exhibits edema (2+ pitting edema bilaterally).  BLE swelling noted;  RLE worse than LLE  Neurological: She is alert and oriented to person, place, and time.  Skin: Skin is warm and dry.  Nursing note and vitals reviewed.  ED Course  Procedures   DIAGNOSTIC STUDIES:  Oxygen Saturation is 100% on RA, normal by my interpretation.    COORDINATION OF CARE:  12:28 AM Discussed treatment plan with pt at bedside and pt agreed to plan.  2:15 AM SOB is worsening will place on CPAP.   2:45 AM Critical care will come assess pt in the ED.     Labs Review Labs Reviewed  BASIC METABOLIC PANEL - Abnormal; Notable for the following:    Glucose, Bld 129 (*)    BUN 30 (*)    Creatinine, Ser 1.45 (*)    GFR calc non Af Amer 31 (*)    GFR calc Af Amer 36 (*)    All other components within normal limits  CBC - Abnormal; Notable for the following:    RBC 2.62 (*)    Hemoglobin 6.7 (*)    HCT 22.2 (*)    MCH 25.6 (*)    RDW 20.1 (*)    All other components within normal limits  BRAIN NATRIURETIC PEPTIDE - Abnormal; Notable for the following:    B Natriuretic Peptide 1250.5 (*)    All other components within normal limits  PROTIME-INR - Abnormal; Notable for the following:    Prothrombin Time 18.2 (*)    INR  1.50 (*)    All other components within normal limits  D-DIMER, QUANTITATIVE (NOT AT [facility]) - Abnormal; Notable for the following:    D-Dimer, Quant 2.01 (*)    All other components within normal limits  TROPONIN I - Abnormal; Notable for the following:    Troponin I 0.04 (*)    All other components within normal limits  TROPONIN I - Abnormal; Notable for the following:    Troponin I 0.04 (*)    All other components within normal limits  TROPONIN I - Abnormal; Notable for the following:    Troponin I 0.04 (*)    All other components within normal limits  CBC - Abnormal; Notable for the following:    RBC 2.49 (*)    Hemoglobin 6.4 (*)    HCT 21.6 (*)    MCH 25.7 (*)    MCHC 29.6 (*)    RDW 20.3 (*)    All other components within normal limits  CBC - Abnormal; Notable for the following:    RBC 3.49 (*)    Hemoglobin 9.5 (*)    HCT 29.7 (*)    RDW 18.7 (*)    All other components within normal limits  CBC - Abnormal; Notable for the following:    RBC 3.51 (*)    Hemoglobin 9.5 (*)    HCT 29.7 (*)    RDW 18.9 (*)    All other components within normal limits  URINALYSIS, ROUTINE W REFLEX MICROSCOPIC (NOT AT [facility]) - Abnormal; Notable for the following:    Protein, ur 30 (*)    Urobilinogen, UA 2.0 (*)    Leukocytes, UA TRACE (*)    All other components within normal limits  HEPATIC FUNCTION PANEL - Abnormal; Notable for the following:    Total Protein 6.2 (*)    Albumin 2.8 (*)    AST 104 (*)    ALT 70 (*)    All other components within normal limits  CBC - Abnormal; Notable for the following:    RBC 3.35 (*)    Hemoglobin 9.2 (*)    HCT 28.4 (*)    RDW 19.1 (*)    All other components within  normal limits  CBC - Abnormal; Notable for the following:    RBC 3.23 (*)    Hemoglobin 8.6 (*)    HCT 27.0 (*)    RDW 19.1 (*)    All other components within normal limits  BASIC METABOLIC PANEL - Abnormal; Notable for the following:    Glucose, Bld 137 (*)    BUN 30 (*)     Creatinine, Ser 1.40 (*)    Calcium 8.5 (*)    GFR calc non Af Amer 32 (*)    GFR calc Af Amer 37 (*)    All other components within normal limits  PHOSPHORUS - Abnormal; Notable for the following:    Phosphorus 5.5 (*)    All other components within normal limits  CBC - Abnormal; Notable for the following:    RBC 3.28 (*)    Hemoglobin 8.8 (*)    HCT 27.9 (*)    RDW 19.3 (*)    All other components within normal limits  CBC - Abnormal; Notable for the following:    RBC 3.20 (*)    Hemoglobin 8.8 (*)    HCT 27.3 (*)    RDW 19.2 (*)    All other components within normal limits  CBC - Abnormal; Notable for the following:    RBC 3.19 (*)    Hemoglobin 8.8 (*)    HCT 26.7 (*)    RDW 18.9 (*)    All other components within normal limits  BASIC METABOLIC PANEL - Abnormal; Notable for the following:    Glucose, Bld 130 (*)    BUN 29 (*)    Creatinine, Ser 1.26 (*)    Calcium 6.9 (*)    GFR calc non Af Amer 37 (*)    GFR calc Af Amer 42 (*)    All other components within normal limits  CBC - Abnormal; Notable for the following:    RBC 3.20 (*)    Hemoglobin 8.7 (*)    HCT 27.2 (*)    RDW 19.1 (*)    All other components within normal limits  MAGNESIUM - Abnormal; Notable for the following:    Magnesium 1.5 (*)    All other components within normal limits  BASIC METABOLIC PANEL - Abnormal; Notable for the following:    Potassium 3.0 (*)    Chloride 99 (*)    Glucose, Bld 111 (*)    BUN 23 (*)    Creatinine, Ser 1.19 (*)    Calcium 8.1 (*)    GFR calc non Af Amer 39 (*)    GFR calc Af Amer 46 (*)    All other components within normal limits  CBC - Abnormal; Notable for the following:    RBC 3.02 (*)    Hemoglobin 8.3 (*)    HCT 26.2 (*)    RDW 18.9 (*)    All other components within normal limits  BASIC METABOLIC PANEL - Abnormal; Notable for the following:    Chloride 100 (*)    BUN 25 (*)    Creatinine, Ser 1.29 (*)    Calcium 8.2 (*)    GFR calc non Af Amer 36  (*)    GFR calc Af Amer 41 (*)    Anion gap 3 (*)    All other components within normal limits  CBC - Abnormal; Notable for the following:    RBC 2.89 (*)    Hemoglobin 8.1 (*)    HCT 25.4 (*)    RDW  18.8 (*)    All other components within normal limits  POC OCCULT BLOOD, ED - Abnormal; Notable for the following:    Fecal Occult Bld POSITIVE (*)    All other components within normal limits  MRSA PCR SCREENING  TROPONIN I  LACTIC ACID, PLASMA  MAGNESIUM  PHOSPHORUS  APTT  URINE MICROSCOPIC-ADD ON  MAGNESIUM  PHOSPHORUS  MAGNESIUM  PHOSPHORUS  POCT GASTRIC OCCULT BLOOD (1-CARD TO LAB)  TYPE AND SCREEN  PREPARE RBC (CROSSMATCH)  ABO/RH    Imaging Review No results found.   EKG Interpretation   Date/Time:  Saturday April 18 2015 00:10:21 EDT Ventricular Rate:  126 PR Interval:    QRS Duration: 65 QT Interval:  304 QTC Calculation: 440 R Axis:   116 Text Interpretation:  Atrial fibrillation with rvr Right axis deviation  Abnormal lateral Q waves Minimal ST depression, inferior leads atrial  fibrillation is new Confirmed by Rhunette Croft, MD, Janey Genta ([id number]) on 04/18/2015  12:45:41 AM      MDM   Final diagnoses:  Symptomatic anemia  Atrial fibrillation with RVR   Medical screening examination/treatment/procedure(s) were performed by me as the supervising physician. Scribe service was utilized for documentation only.  PT comes in with cc of dib. She is noted to be in acute respiratory distress. Her BP is slightly elevated, and she is also in afib with RVR. Pt has been taking her meds as prescribed. There is no fevers. Pt has hx of CHF.  WE started with bipap and iv fluid support while awaiting for the lab results. Lab results show acute anemia and elevated dimer (for unilateral extremity swelling, will need DVT r/o - admitting CCM fellow made aware), normal trops, elevated BNP. CXR is showing some pulm congestion. PT is is acute anemia. Hemoccult ordered. She denies any  melena or bleeding elsewhere. No hx of bleeds.  IT appears that the dib is due to acute anemia - as it has gradually gotten worse. THe afib with RVR, which is also likely exacerbated by the acute anemia, can be managed with supportive therapy. We have placed pads, and will start transfusion. Will not be very aggressive at getting tate controlled right off the bat - but she might need lasix and gradual rate control, otherwise she might go into flash pulm edema and hypoxia.   Code: Full code.  CRITICAL CARE Performed by: Janet Bond   Total critical care time: 42 minutes   Critical care time was exclusive of separately billable procedures and treating other patients.  Critical care was necessary to treat or prevent imminent or life-threatening deterioration.  Critical care was time spent personally by me on the following activities: development of treatment plan with patient and/or surrogate as well as nursing, discussions with consultants, evaluation of patient's response to treatment, examination of patient, obtaining history from patient or surrogate, ordering and performing treatments and interventions, ordering and review of laboratory studies, ordering and review of radiographic studies, pulse oximetry and re-evaluation of patient's condition.   Janet Kaplan, MD 04/25/15 1806

## 2015-04-18 NOTE — ED Notes (Signed)
Per EMS pt started having SOB 3 days ago when released from rehab (hx of stroke, MI); Pt from home with family; Pt is alert; Pt has pedal edema which has gotten worse since rehab; unknown if pt has hx of afib; Pt uses wheelchair at home; pt is on 3 l/m 02 at home

## 2015-04-18 NOTE — Progress Notes (Signed)
Pt is complaining of feeling SOB. Her sats are 100% on 4L [place] and RR 19. Respiratory is putting BiPAP on pt. Will continue to monitor.

## 2015-04-18 NOTE — ED Notes (Signed)
MD at bedside. 

## 2015-04-18 NOTE — ED Notes (Signed)
Patient transported to X-ray 

## 2015-04-18 NOTE — Progress Notes (Signed)
Left lower extremity venous duplex. Preliminary results = negative for DVT.  Farrel Demark, RDMS, RVT 04/18/2015

## 2015-04-18 NOTE — ED Notes (Signed)
Per Pt it is ok to release medical info to [place], pt's grand daughter and POA

## 2015-04-18 NOTE — Progress Notes (Signed)
Placed patient on BIPAP due to increased work of breathing and unstable vitals. Patient is tolerating it well and RT will continue to monitor.

## 2015-04-18 NOTE — Progress Notes (Signed)
eLink Physician-Brief Progress Note Patient Name: Janet Bond DOB: Sep 21, 1923 MRN: [id number]   Date of Service  04/18/2015  HPI/Events of Note  Ongoing hypertension with SBP of 180 and tachycardia in patient on dilt gtt.  eICU Interventions  Plan: PRN lopressor ordered for HR and BP control     Intervention Category Intermediate Interventions: Arrhythmia - evaluation and management;Hypertension - evaluation and management  DETERDING,ELIZABETH 04/18/2015, 5:56 AM

## 2015-04-18 NOTE — Progress Notes (Signed)
Pt had increased HR and RR. RT placed pt back on bi-pap. RN aware. Pt is resting comfortably. RT will continue to monitor.

## 2015-04-18 NOTE — Progress Notes (Signed)
RT took pt off Bi-PAP and placed her on a 4 LPM [place]. Pt's sat is currently 100 and RR are 21. RN is aware that pt is off Bi-pap. RT will continue to monitor.

## 2015-04-18 NOTE — ED Notes (Signed)
Respiratory at bedside; pt c/o SOB but 100% on 3 L/M 02; Pt is sitting upright position

## 2015-04-18 NOTE — ED Notes (Signed)
Admitting MD at bedside.

## 2015-04-19 DIAGNOSIS — J810 Acute pulmonary edema: Secondary | ICD-10-CM

## 2015-04-19 DIAGNOSIS — N179 Acute kidney failure, unspecified: Secondary | ICD-10-CM

## 2015-04-19 DIAGNOSIS — J9601 Acute respiratory failure with hypoxia: Secondary | ICD-10-CM

## 2015-04-19 LAB — BASIC METABOLIC PANEL
ANION GAP: 9 (ref 5–15)
BUN: 30 mg/dL — AB (ref 6–20)
CO2: 25 mmol/L (ref 22–32)
CREATININE: 1.4 mg/dL — AB (ref 0.44–1.00)
Calcium: 8.5 mg/dL — ABNORMAL LOW (ref 8.9–10.3)
Chloride: 102 mmol/L (ref 101–111)
GFR, EST AFRICAN AMERICAN: 37 mL/min — AB (ref >60)
GFR, EST NON AFRICAN AMERICAN: 32 mL/min — AB (ref >60)
Glucose, Bld: 137 mg/dL — ABNORMAL HIGH (ref 65–99)
Potassium: 4.2 mmol/L (ref 3.5–5.1)
Sodium: 136 mmol/L (ref 135–145)

## 2015-04-19 LAB — CBC
HCT: 27 % — ABNORMAL LOW (ref 36.0–46.0)
HCT: 27.3 % — ABNORMAL LOW (ref 36.0–46.0)
HCT: 27.9 % — ABNORMAL LOW (ref 36.0–46.0)
HCT: 28.4 % — ABNORMAL LOW (ref 36.0–46.0)
HEMOGLOBIN: 8.8 g/dL — AB (ref 12.0–15.0)
HEMOGLOBIN: 8.8 g/dL — AB (ref 12.0–15.0)
Hemoglobin: 8.6 g/dL — ABNORMAL LOW (ref 12.0–15.0)
Hemoglobin: 9.2 g/dL — ABNORMAL LOW (ref 12.0–15.0)
MCH: 26.6 pg (ref 26.0–34.0)
MCH: 26.8 pg (ref 26.0–34.0)
MCH: 27.5 pg (ref 26.0–34.0)
MCH: 27.5 pg (ref 26.0–34.0)
MCHC: 31.5 g/dL (ref 30.0–36.0)
MCHC: 31.9 g/dL (ref 30.0–36.0)
MCHC: 32.2 g/dL (ref 30.0–36.0)
MCHC: 32.4 g/dL (ref 30.0–36.0)
MCV: 83.6 fL (ref 78.0–100.0)
MCV: 84.8 fL (ref 78.0–100.0)
MCV: 85.1 fL (ref 78.0–100.0)
MCV: 85.3 fL (ref 78.0–100.0)
PLATELETS: 227 10*3/uL (ref 150–400)
PLATELETS: 237 10*3/uL (ref 150–400)
Platelets: 244 10*3/uL (ref 150–400)
Platelets: 226 10*3/uL (ref 150–400)
RBC: 3.2 MIL/uL — AB (ref 3.87–5.11)
RBC: 3.23 MIL/uL — ABNORMAL LOW (ref 3.87–5.11)
RBC: 3.28 MIL/uL — ABNORMAL LOW (ref 3.87–5.11)
RBC: 3.35 MIL/uL — ABNORMAL LOW (ref 3.87–5.11)
RDW: 19.1 % — AB (ref 11.5–15.5)
RDW: 19.1 % — ABNORMAL HIGH (ref 11.5–15.5)
RDW: 19.2 % — ABNORMAL HIGH (ref 11.5–15.5)
RDW: 19.3 % — AB (ref 11.5–15.5)
WBC: 6.2 10*3/uL (ref 4.0–10.5)
WBC: 6.7 10*3/uL (ref 4.0–10.5)
WBC: 6.8 10*3/uL (ref 4.0–10.5)
WBC: 7 10*3/uL (ref 4.0–10.5)

## 2015-04-19 LAB — MAGNESIUM: Magnesium: 1.9 mg/dL (ref 1.7–2.4)

## 2015-04-19 LAB — PHOSPHORUS: PHOSPHORUS: 5.5 mg/dL — AB (ref 2.5–4.6)

## 2015-04-19 MED ORDER — FUROSEMIDE 10 MG/ML IJ SOLN
40.0000 mg | Freq: Once | INTRAMUSCULAR | Status: AC
  Administered 2015-04-19: 40 mg via INTRAVENOUS
  Filled 2015-04-19: qty 4

## 2015-04-19 MED ORDER — MENTHOL 3 MG MT LOZG
1.0000 | LOZENGE | OROMUCOSAL | Status: DC | PRN
  Filled 2015-04-19: qty 9

## 2015-04-19 MED ORDER — METOPROLOL TARTRATE 12.5 MG HALF TABLET
12.5000 mg | ORAL_TABLET | Freq: Two times a day (BID) | ORAL | Status: DC
  Administered 2015-04-19 – 2015-04-20 (×3): 12.5 mg via ORAL
  Filled 2015-04-19 (×4): qty 1

## 2015-04-19 MED ORDER — FUROSEMIDE 10 MG/ML IJ SOLN
80.0000 mg | INTRAMUSCULAR | Status: AC
  Administered 2015-04-19: 80 mg via INTRAVENOUS
  Filled 2015-04-19: qty 8

## 2015-04-19 MED ORDER — LORAZEPAM 2 MG/ML IJ SOLN
0.5000 mg | Freq: Four times a day (QID) | INTRAMUSCULAR | Status: DC | PRN
  Administered 2015-04-20: 0.5 mg via INTRAVENOUS
  Filled 2015-04-19: qty 1

## 2015-04-19 NOTE — Progress Notes (Signed)
Pt had been on a 4 LPM [place] most of the night and the first part of the morning. RT was called to the room due to the pt being SOB and using accessory muscles. RT placed pt back on bi-pap and had RN contact CCM to see about giving lasix. RT will continue to monitor.

## 2015-04-19 NOTE — Progress Notes (Signed)
RT went to do pt 1200 bi-pap check and pt was back on a 4 LPM [place]. Sat is currently 100% RR 21. RT will continue to monitor.

## 2015-04-19 NOTE — Progress Notes (Signed)
Pt became SOB, labored breathing, RR 25-30, using accessory muscles, fine crackles at the bases.  She was also hypertensive and tachycardic, symptoms started after eating breakfast and pt still had mouth full of food and coughing up food.  Mouth was cleaned and BIPAP restarted. Dr Delton Coombes was paged and ordered IV lasix.

## 2015-04-19 NOTE — Evaluation (Signed)
Clinical/Bedside Swallow Evaluation Patient Details  Name: Janet Bond MRN: [id number] Date of Birth: Jan 23, 1923  Today's Date: 04/19/2015 Time: SLP Start Time (ACUTE ONLY): 1346 SLP Stop Time (ACUTE ONLY): 1407 SLP Time Calculation (min) (ACUTE ONLY): 21 min  Past Medical History:  Past Medical History  Diagnosis Date  . Essential hypertension   . Type II diabetes mellitus   . Questionable History of Atrial Fibrillation     a. questionable hx of this->no afib during 12/2014 hospitalization.  . CVA (cerebral infarction)     a. 01/16/15: embolic event following an acute myocardial infarction and cardiac catheterization  . Mild Aortic Stenosis     a. mild by 2D ECHO 12/2014:  Mean gradient (S): 11 mm Hg. Peak gradient (S): 20 mm Hg. Valve area (VTI): 1.36 cm^2. Valve area (Vmax): 1.17 cm^2. Valve area (Vmean): 1.14 cm^2.  Marland Kitchen CAD (coronary artery disease)     a. 01/09/15: Acute inf STEMI/PCI: LM nl, LAD/LCX mod dzs, RPL 100 (s/p PTCA/aspiration thrombectomy).  Marland Kitchen HLD (hyperlipidemia)   . Carotid artery disease     a. carotid dopplers: 01/18/15 w/ bIlateral intimal wall thickening CCA. Moderate to severe calcific plaque origin and proximal ICA and ECA with acoustic shadowing. 1-39% ICA stenosis. Right vertebral artery flow is to-fro suggesting proximal stenosis  . H/O echocardiogram     a. 12/2014 Echo: EF 60-65%, Gr 2 DD, mild AS/AI.  Marland Kitchen Stroke    Past Surgical History:  Past Surgical History  Procedure Laterality Date  . Wrist fracture surgery    . Leg surgery    . Left heart catheterization with coronary angiogram N/A 01/12/2015    Procedure: LEFT HEART CATHETERIZATION WITH CORONARY ANGIOGRAM;  Surgeon: Corky Crafts, MD;  Location: [facility] CATH LAB;  Service: Cardiovascular;  Laterality: N/A;   HPI:  Pt is an 81 year old female with h/o DM, HFpEF, Afib (on clopidogrel and apixaban) , CAD, pulmonary hypertension and chronic hypoxic respiratory failure requiring 2-3L at home. She was  recently d/ced from SNF (7/20) and was told on d/c that she has pneumonia that they would treat as an outpt. Upon arrival home she remained persistently SOB with DOE and orthopnea. Yesterday she devoloped some significant palpitation that led her to come to the ED, where she was found to be in Afib with RVR, hypertensive with a new anemia from April (10.2->6.7). Since admission pt with fluctuating respiratory support with RT placing on and off Bipap.  RN reports patient choking on diet this am with difficulty with solids. ST consult to evaluate swallowing.  Chest x ray 04-18-15 shows Small residual focal opacity in the right mid lung field and small bilateral pleural effusions   Assessment / Plan / Recommendation Clinical Impression  Pt exhibiting mild oral dysphagia. Pt with choking event this am at breakfast meal with solid PO. Noted prolonged and effortful mastication of solids and delayed AP transfer. No signs or symptoms of aspiration during BSE. Recommend diet downgrade to dysphagia 2 (chopped) and thin liquids. Continue medicine administration whole with thin liquids. ST to follow up for diet tolerance.       Aspiration Risk  Mild    Diet Recommendation Dysphagia 2 (Fine chop);Thin   Medication Administration: Whole meds with liquid Compensations: Minimize environmental distractions;Small sips/bites;Slow rate;Check for pocketing;Follow solids with liquid    Other  Recommendations Oral Care Recommendations: Oral care BID   Follow Up Recommendations       Frequency and Duration min 2x/week  1 week  Pertinent Vitals/Pain     SLP Swallow Goals     Swallow Study Prior Functional Status       General Date of Onset: 04/17/15 Other Pertinent Information: Pt is an 81 year old female with h/o DM, HFpEF, Afib (on clopidogrel and apixaban) , CAD, pulmonary hypertension and chronic hypoxic respiratory failure requiring 2-3L at home. She was recently d/ced from SNF (7/20) and was told on  d/c that she has pneumonia that they would treat as an outpt. Upon arrival home she remained persistently SOB with DOE and orthopnea. Yesterday she devoloped some significant palpitation that led her to come to the ED, where she was found to be in Afib with RVR, hypertensive with a new anemia from April (10.2->6.7). Since admission pt with fluctuating respiratory support with RT placing on and off Bipap.  RN reports patient choking on diet this am with difficulty with solids. ST consult to evaluate swallowing.  Chest x ray 04-18-15 shows Small residual focal opacity in the right mid lung field and small bilateral pleural effusions Type of Study: Bedside swallow evaluation Diet Prior to this Study: Regular;Thin liquids Temperature Spikes Noted: Yes Respiratory Status: Supplemental O2 delivered via (comment) History of Recent Intubation: No Behavior/Cognition: Alert;Cooperative;Pleasant mood Oral Cavity - Dentition: Other (Comment) (upper and lower dentures ) Self-Feeding Abilities: Able to feed self;Needs assist Patient Positioning: Upright in bed Baseline Vocal Quality: Normal Volitional Cough: Strong Volitional Swallow: Able to elicit    Oral/Motor/Sensory Function Overall Oral Motor/Sensory Function: Appears within functional limits for tasks assessed Labial ROM: Within Functional Limits Labial Symmetry: Within Functional Limits Labial Strength: Within Functional Limits Lingual ROM: Within Functional Limits Lingual Symmetry: Within Functional Limits Lingual Strength: Within Functional Limits Facial Symmetry: Within Functional Limits   Ice Chips Ice chips: Within functional limits   Thin Liquid Thin Liquid: Within functional limits    Nectar Thick Nectar Thick Liquid: Not tested   Honey Thick Honey Thick Liquid: Not tested   Puree Puree: Within functional limits   Solid   GO   Marcene Duos MA, CCC-SLP Acute Care Speech Language Pathologist    Solid: Impaired Presentation:  Spoon Oral Phase Impairments: Impaired mastication       [place], Zeniya Lapidus E 04/19/2015,2:13 PM

## 2015-04-19 NOTE — Clinical Documentation Improvement (Signed)
"  Decompensated CHF" is documented throughout the progress notes. Pease help by providing greater specificity for the heart failure in the progress note.   . Document type --Diastolic --Systolic --Combined systolic and diastolic   Thank You, Saul Fordyce ,RN Clinical Documentation Specialist:  [phone]  [facility]- Health Information Management. Document acuity

## 2015-04-19 NOTE — Progress Notes (Signed)
Utilization review complete. Loveta Dellis RN CCM Case Mgmt phone [phone] 

## 2015-04-19 NOTE — Progress Notes (Signed)
PULMONARY / CRITICAL CARE MEDICINE   Name: Janet Bond MRN: [id number] DOB: 08/29/1923    ADMISSION DATE:  04/17/2015 CONSULTATION DATE:  04/18/2015  REFERRING MD :  Dr. Debara Pickett (ED)  CHIEF COMPLAINT:  Afib RVR, anemia  HISTORY OF PRESENT ILLNESS:  84F with h/o DM, HFpEF, Afib (on clopidogrel and apixaban) , CAD, pulmonary hypertension and chronic hypoxic respiratory failure requiring 2-3L at home.  She was recently d/ced from SNF (7/20) and was told on d/c that she has pneumonia that they would treat as an outpt.  Upon arrival home she remained persistently SOB with DOE and orthopnea.  Yesterday she devoloped some significant palpitation that led her to come to the ED, where she was found to be in Afib with RVR, hypertensive with a new anemia from April (10.2->6.7).  She reports dark Tarry stool at home but her granddaughter stated her last BM at home was normal colored.  She also reports increased LE edema compared to usual.  SUBJECTIVE: Aspiration event this AM, severe HTN and pulmonary edema requiring lasix and BiPAP.  VITAL SIGNS: Temp:  [97.9 F (36.6 C)-99.2 F (37.3 C)] 99.2 F (37.3 C) (07/24 0350) Pulse Rate:  [40-132] 122 (07/24 0842) Resp:  [17-25] 21 (07/24 0842) BP: (117-187)/(58-118) 178/108 mmHg (07/24 0842) SpO2:  [100 %] 100 % (07/24 0842) FiO2 (%):  [40 %] 40 % (07/23 1528) Weight:  [60.2 kg (132 lb 11.5 oz)] 60.2 kg (132 lb 11.5 oz) (07/24 0350)   VENTILATOR SETTINGS: Bipap 10/5 40% Vent Mode:  [-]  FiO2 (%):  [40 %] 40 % INTAKE / OUTPUT:  Intake/Output Summary (Last 24 hours) at 04/19/15 0942 Last data filed at 04/19/15 0400  Gross per 24 hour  Intake  647.5 ml  Output   2000 ml  Net -1352.5 ml   PHYSICAL EXAMINATION: General:  Mild respiratory distress Neuro:  AAOx3, CN II-XII intact HEENT:  Poor dentition. + JVD to 10cm, bipap mask in place. Cardiovascular:  Irreg rate/rhythm. No m/r/g Lungs:  Minimal LLL rales. No wheezing Abdomen:  Soft, non tender  with palpable stool Skin:  No rashes LE: 2-3+ pitting edema b/l to knee DRE: good tone, + liquid and impacted stool in vault.  No obvious melena or blood.    LABS:  CBC  Recent Labs Lab 04/18/15 1542 04/19/15 04/19/15 0654  WBC 6.2 6.7 6.2  HGB 9.5* 9.2* 8.6*  HCT 29.7* 28.4* 27.0*  PLT 251 227 244   Coag's  Recent Labs Lab 04/18/15 0103 04/18/15 0355  APTT  --  31  INR 1.50*  --    BMET  Recent Labs Lab 04/18/15 0103 04/19/15  NA 139 136  K 4.9 4.2  CL 106 102  CO2 23 25  BUN 30* 30*  CREATININE 1.45* 1.40*  GLUCOSE 129* 137*   Electrolytes  Recent Labs Lab 04/18/15 0103 04/18/15 0355 04/19/15  CALCIUM 8.9  --  8.5*  MG  --  2.2 1.9  PHOS  --  4.1 5.5*   Sepsis Markers  Recent Labs Lab 04/18/15 0355  LATICACIDVEN 1.5   ABG No results for input(s): PHART, PCO2ART, PO2ART in the last 168 hours. Liver Enzymes  Recent Labs Lab 04/18/15 0355  AST 104*  ALT 70*  ALKPHOS 72  BILITOT 1.0  ALBUMIN 2.8*   Cardiac Enzymes  Recent Labs Lab 04/18/15 0355 04/18/15 1345 04/18/15 1542  TROPONINI 0.04* 0.04* 0.04*   Glucose No results for input(s): GLUCAP in the last 168 hours.  Imaging  No results found.   ASSESSMENT / PLAN: 35 F admitted with afib with RVR in the setting of volume overload/decompensated HFpEF with increased WOB.  Found to have new acute drop in HB, may reflect slow GIB  PULMONARY A: Acute on chronic hypoxemic respiratory failure.  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  At this time she is controlled on bipap. P:   - Diuresis with lasix 40 mg IV q8 x2 doses. - Restart BiPAP given acute event. - Diureses. - Transfuse 1U PRBC - Rate control Afib as noted below - No emergent indication for intubation. - RML infiltrate noted. - Pulmonary nodule, will work that up after acute phase is over but doubtful that patient will be a candidate for  much.  CARDIOVASCULAR A: Afib with RVR likely 2/2 volume overload and decompensated CHF with component of anemia. BNP1200. CHADS2 >3 however with acute hemaglobin drop on dual anticoag therapy. P:  - Diuresis as ordered. - Transfusion as above - Cont home carvedilol - Diltiazem  IVP x1 and gtt - Resume home oral dilt after diuresis - Trop mildly positive. - Holding Plavix and apixiban for now.given hb drop, will re-evaluate when more stable. - HTN - diltiazem gtt goal SBP <160 and <90 DBP - PO dilt 60 mg PO q6 with holding parameters. - Lopressor added for HTN and tachycardia.   RENAL A:  AKI Scr 1.45 from 1.0 last admission.  Likely cardiorenal in the setting of decompensated CHF. P:   - Foley - Diuresis as above - BMET in AM. - Replace electrolytes as indicated. - Lasix 80 mg IV x1 then 40 mg IV at noon.  GASTROINTESTINAL A:  Hb drop with FOBT + concern for GIB.  DRE negative for blood.  Unclear precipitant other than dual anti-coag therapy.  INR is 1.5.  Hg now stable and no evidence of black stool. P:   - Hold anti-coag. - Hold off calling GI for now. - CBC in AM. - Protonix IV BID. - No h/o cirrhosis or varicies to warrant CTX or octreotide gtt. - Transfusion if <7.  HEMATOLOGIC A:  Acute anemia.  Unclear etiology, suspect slow GIB vs anemia of chronic disease.  No back pain to suggest P:  - CBC q6hrs - Tx of GIB as above - Transfuse if Hb <7  Patient was previously DNR, now full code, will need to discuss further when grand daughter is available.  Attempted to discuss this with patient but she would like to discuss it with her grand daughter first.  Full code for now.  The patient is critically ill with multiple organ systems failure and requires high complexity decision making for assessment and support, frequent evaluation and titration of therapies, application of advanced monitoring technologies and extensive interpretation of multiple databases.   Critical  Care Time devoted to patient care services described in this note is  35  Minutes. This time reflects time of care of this signee Dr Koren Bound. This critical care time does not reflect procedure time, or teaching time or supervisory time of PA/NP/Med student/Med Resident etc but could involve care discussion time.  Alyson Reedy, M.D. [facility] Pulmonary/Critical Care Medicine. Pager: [phone]. After hours pager: [phone].  04/19/2015, 9:42 AM

## 2015-04-19 NOTE — Clinical Documentation Improvement (Signed)
  A cause and effect relationship may not be assumed and must be documented by a provider. Please clarify the relationship, if any, between  HTN and  CHF .  Are the conditions:  Due to or associated with each other  Unrelated to each other  Unable to determine  Unknown    Thank you,  Saul Fordyce ,RN Clinical Documentation Specialist:  [phone]  [facility]- Health Information Management

## 2015-04-20 ENCOUNTER — Inpatient Hospital Stay (HOSPITAL_COMMUNITY): Payer: Medicare Other

## 2015-04-20 DIAGNOSIS — I4891 Unspecified atrial fibrillation: Secondary | ICD-10-CM

## 2015-04-20 DIAGNOSIS — I10 Essential (primary) hypertension: Secondary | ICD-10-CM

## 2015-04-20 LAB — BASIC METABOLIC PANEL
Anion gap: 9 (ref 5–15)
BUN: 29 mg/dL — ABNORMAL HIGH (ref 6–20)
CALCIUM: 6.9 mg/dL — AB (ref 8.9–10.3)
CO2: 26 mmol/L (ref 22–32)
Chloride: 101 mmol/L (ref 101–111)
Creatinine, Ser: 1.26 mg/dL — ABNORMAL HIGH (ref 0.44–1.00)
GFR calc Af Amer: 42 mL/min — ABNORMAL LOW (ref >60)
GFR, EST NON AFRICAN AMERICAN: 37 mL/min — AB (ref >60)
Glucose, Bld: 130 mg/dL — ABNORMAL HIGH (ref 65–99)
POTASSIUM: 4.5 mmol/L (ref 3.5–5.1)
Sodium: 136 mmol/L (ref 135–145)

## 2015-04-20 LAB — CBC
HCT: 27.2 % — ABNORMAL LOW (ref 36.0–46.0)
HEMATOCRIT: 26.7 % — AB (ref 36.0–46.0)
HEMOGLOBIN: 8.7 g/dL — AB (ref 12.0–15.0)
Hemoglobin: 8.8 g/dL — ABNORMAL LOW (ref 12.0–15.0)
MCH: 27.6 pg (ref 26.0–34.0)
MCH: 27.2 pg (ref 26.0–34.0)
MCHC: 33 g/dL (ref 30.0–36.0)
MCHC: 32 g/dL (ref 30.0–36.0)
MCV: 83.7 fL (ref 78.0–100.0)
MCV: 85 fL (ref 78.0–100.0)
PLATELETS: 227 10*3/uL (ref 150–400)
Platelets: 243 10*3/uL (ref 150–400)
RBC: 3.19 MIL/uL — AB (ref 3.87–5.11)
RBC: 3.2 MIL/uL — AB (ref 3.87–5.11)
RDW: 19.1 % — ABNORMAL HIGH (ref 11.5–15.5)
RDW: 18.9 % — AB (ref 11.5–15.5)
WBC: 6.8 10*3/uL (ref 4.0–10.5)
WBC: 7.2 10*3/uL (ref 4.0–10.5)

## 2015-04-20 LAB — PHOSPHORUS: Phosphorus: 4.5 mg/dL (ref 2.5–4.6)

## 2015-04-20 LAB — MAGNESIUM: MAGNESIUM: 1.5 mg/dL — AB (ref 1.7–2.4)

## 2015-04-20 MED ORDER — MAGNESIUM SULFATE 2 GM/50ML IV SOLN
2.0000 g | Freq: Once | INTRAVENOUS | Status: AC
  Administered 2015-04-20: 2 g via INTRAVENOUS
  Filled 2015-04-20: qty 50

## 2015-04-20 MED ORDER — PNEUMOCOCCAL VAC POLYVALENT 25 MCG/0.5ML IJ INJ
0.5000 mL | INJECTION | INTRAMUSCULAR | Status: AC
  Administered 2015-04-21: 0.5 mL via INTRAMUSCULAR
  Filled 2015-04-20: qty 0.5

## 2015-04-20 MED ORDER — FUROSEMIDE 10 MG/ML IJ SOLN
40.0000 mg | Freq: Three times a day (TID) | INTRAMUSCULAR | Status: AC
  Administered 2015-04-20 (×2): 40 mg via INTRAVENOUS
  Filled 2015-04-20 (×2): qty 4

## 2015-04-20 MED ORDER — POTASSIUM CHLORIDE CRYS ER 20 MEQ PO TBCR
40.0000 meq | EXTENDED_RELEASE_TABLET | Freq: Once | ORAL | Status: AC
  Administered 2015-04-20: 40 meq via ORAL
  Filled 2015-04-20: qty 2

## 2015-04-20 NOTE — Progress Notes (Signed)
SLP Cancellation Note  Patient Details Name: Janet Bond MRN: [id number] DOB: Oct 12, 1922   Cancelled treatment:        RN just left pt's room and reported being unable to adequately keep her awake for lunch. ST will continue efforts next date for swallow intervention.    [name] 04/20/2015, 2:03 PM   [name].Ed [employer] [phone]

## 2015-04-20 NOTE — Progress Notes (Signed)
Patient arrived on unit via bed from [facility]  Patient denies pain.  Will continue to monitor.

## 2015-04-20 NOTE — Progress Notes (Signed)
PULMONARY / CRITICAL CARE MEDICINE   Name: Janet Bond MRN: [id number] DOB: [birth year]    ADMISSION DATE:  04/17/2015 CONSULTATION DATE:  04/18/2015  REFERRING MD :  Dr. Debara Pickett (ED)  CHIEF COMPLAINT:  Afib RVR, anemia  HISTORY OF PRESENT ILLNESS:  21F with h/o DM, HFpEF, Afib (on clopidogrel and apixaban) , CAD, pulmonary hypertension and chronic hypoxic respiratory failure requiring 2-3L at home.  She was recently d/ced from SNF (7/20) and was told on d/c that she has pneumonia that they would treat as an outpt.  Upon arrival home she remained persistently SOB with DOE and orthopnea.  Yesterday she devoloped some significant palpitation that led her to come to the ED, where she was found to be in Afib with RVR, hypertensive with a new anemia from April (10.2->6.7).  She reports dark Tarry stool at home but her granddaughter stated her last BM at home was normal colored.  She also reports increased LE edema compared to usual.  SUBJECTIVE: Respiratory status stable overnight but HR increased requiring dilt drip.  VITAL SIGNS: Temp:  [97.7 F (36.5 C)-98.6 F (37 C)] 97.9 F (36.6 C) (07/25 0800) Pulse Rate:  [38-134] 98 (07/25 1000) Resp:  [16-25] 19 (07/25 1000) BP: (116-161)/(56-97) 116/60 mmHg (07/25 1000) SpO2:  [100 %] 100 % (07/25 1000) Weight:  [60 kg (132 lb 4.4 oz)] 60 kg (132 lb 4.4 oz) (07/25 0356)   VENTILATOR SETTINGS: Bipap 10/5 40%   INTAKE / OUTPUT:  Intake/Output Summary (Last 24 hours) at 04/20/15 1106 Last data filed at 04/20/15 0900  Gross per 24 hour  Intake    610 ml  Output   1877 ml  Net  -1267 ml   PHYSICAL EXAMINATION: General:  No respiratory distress Neuro:  AAOx3, CN II-XII intact HEENT:  Poor dentition. + JVD to 10cm, bipap mask in place. Cardiovascular:  Irreg rate/rhythm. No m/r/g Lungs:  Minimal LLL rales. No wheezing Abdomen:  Soft, non tender with palpable stool Skin:  No rashes LE: 1+ pitting edema b/l to knee DRE: good tone, + liquid  and impacted stool in vault.  No obvious melena or blood.    LABS:  CBC  Recent Labs Lab 04/19/15 1820 04/20/15 04/20/15 0449  WBC 7.0 6.8 7.2  HGB 8.8* 8.8* 8.7*  HCT 27.3* 26.7* 27.2*  PLT 226 243 227   Coag's  Recent Labs Lab 04/18/15 0103 04/18/15 0355  APTT  --  31  INR 1.50*  --    BMET  Recent Labs Lab 04/18/15 0103 04/19/15 04/20/15 0449  NA 139 136 136  K 4.9 4.2 4.5  CL 106 102 101  CO2 BUN 30* 30* 29*  CREATININE 1.45* 1.40* 1.26*  GLUCOSE 129* 137* 130*   Electrolytes  Recent Labs Lab 04/18/15 0103 04/18/15 0355 04/19/15 04/20/15 0449  CALCIUM 8.9  --  8.5* 6.9*  MG  --  2.2 1.9 1.5*  PHOS  --  4.1 5.5* 4.5   Sepsis Markers  Recent Labs Lab 04/18/15 0355  LATICACIDVEN 1.5   ABG No results for input(s): PHART, PCO2ART, PO2ART in the last 168 hours. Liver Enzymes  Recent Labs Lab 04/18/15 0355  AST 104*  ALT 70*  ALKPHOS 72  BILITOT 1.0  ALBUMIN 2.8*   Cardiac Enzymes  Recent Labs Lab 04/18/15 0355 04/18/15 1345 04/18/15 1542  TROPONINI 0.04* 0.04* 0.04*   Glucose No results for input(s): GLUCAP in the last 168 hours.  Imaging Dg Chest [facility] 1 [address]  04/20/2015   CLINICAL DATA:  Chronic atrial fibrillation, coronary artery disease, aortic stenosis, diabetes.  EXAM: PORTABLE CHEST - 1 VIEW  COMPARISON:  PA and lateral chest x-ray of July 23rd 2016  FINDINGS: The lungs are adequately inflated. The interstitial markings remain increased. There is confluent alveolar opacity in the right mid lung. Slightly increased density in the retrocardiac region laterally is present today. Small bilateral pleural effusions persist. The cardiac silhouette remains enlarged. The pulmonary vascularity remains engorged. The bony thorax is unremarkable with exception of gentle dextrocurvature of the thoracic spine that may be positional.  IMPRESSION: Allowing for the differences in positioning there has not been dramatic interval change  since the study of July 23rd. There is persistent mild pulmonary interstitial edema, small bilateral pleural effusions, and an area of confluent airspace opacity in the right mid lung which could reflect pneumonia but neoplasm is not excluded.  Chest CT scanning would be useful to further evaluate the right lung.   Electronically Signed   By: David  [place] M.D.   On: 04/20/2015 07:28     ASSESSMENT / PLAN: 43 F admitted with afib with RVR in the setting of volume overload/decompensated HFpEF with increased WOB.  Found to have new acute drop in HB, may reflect slow GIB  PULMONARY A: Acute on chronic hypoxemic respiratory failure.  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  At this time she is controlled on bipap. P:   - Diuresis with lasix 40 mg IV q8 x2 doses. - D/C BiPAP. - Rate control Afib as noted below - DNR status as below. - RML infiltrate noted. - Pulmonary nodule noted, family does not wish for a work up at this time.  CARDIOVASCULAR A: Afib with RVR likely 2/2 volume overload and decompensated CHF with component of anemia. BNP1200. CHADS2 >3 however with acute hemaglobin drop on dual anticoag therapy. P:  - Diuresis as ordered. - Cont home carvedilol - D/C Diltiazem gtt - Resume home oral dilt - Trop mildly positive. - Holding Plavix and apixiban for now given hb drop, will re-evaluate when more stable. - PO dilt 60 mg PO q6 with holding parameters. - Lopressor added for HTN and tachycardia.   RENAL A:  AKI Scr 1.45 from 1.0 last admission.  Likely cardiorenal in the setting of decompensated CHF. P:   - Foley - Diuresis as above - BMET in AM. - Replace electrolytes as indicated. - Lasix 40 mg IV q6 x3 doses. - Replace K.  GASTROINTESTINAL A:  Hb drop with FOBT + concern for GIB.  DRE negative for blood.  Unclear precipitant other than dual anti-coag therapy.  INR is 1.5.  Hg now stable and no evidence of  black stool. P:   - Hold anti-coag. - Hold off calling GI for now. - CBC in AM. - Protonix IV BID. - No h/o cirrhosis or varicies to warrant CTX or octreotide gtt. - Transfusion if <7.  HEMATOLOGIC A:  Acute anemia.  Unclear etiology, suspect slow GIB vs anemia of chronic disease.  No back pain to suggest P:  - CBC q6hrs - Tx of GIB as above - Transfuse if Hb <7  Discussed with [facility], transfer to [facility], make full DNR after discussion with 2 grand daughters, transfer care to [facility], PCCM will sign off.  Alyson Reedy, M.D. [facility] Pulmonary/Critical Care Medicine. Pager: [phone]. After hours pager: [phone].  04/20/2015, 11:06 AM

## 2015-04-20 NOTE — Progress Notes (Signed)
Report given to [facility] RN. Belongings packed up and ready to be sent with patient. Family made aware of transfer.

## 2015-04-20 NOTE — Progress Notes (Signed)
eLink Physician-Brief Progress Note Patient Name: Janet Bond DOB: [birth year] MRN: [id number]   Date of Service  04/20/2015  HPI/Events of Note  Hypomag  eICU Interventions  Mag replaced     Intervention Category Intermediate Interventions: Electrolyte abnormality - evaluation and management  DETERDING,ELIZABETH 04/20/2015, 5:32 AM

## 2015-04-20 NOTE — Care Management Note (Signed)
Case Management Note  Patient Details  Name: Janet Bond MRN: [id number] Date of Birth: Jul 31, 1923  Subjective/Objective:      Adm w chr at fib              Action/Plan: recent snf but had gone home pta.    Expected Discharge Date:                  Expected Discharge Plan:  Skilled Nursing Facility  In-House Referral:  Clinical Social Work  Discharge planning Services  CM Consult  Post Acute Care Choice:    Choice offered to:     DME Arranged:    DME Agency:     HH Arranged:    HH Agency:     Status of Service:     Medicare Important Message Given:    Date Medicare IM Given:    Medicare IM give by:    Date Additional Medicare IM Given:    Additional Medicare Important Message give by:     If discussed at Long Length of Stay Meetings, dates discussed:    Additional Comments: spoke w granddaughters Janet Bond [phone] and Janet Bond [phone]. They feel cannot care for her at home. sw ref for snf. Will cont to follow.out of facility form placed on shadow chart. Dr Molli Knock to put in for pt/ot eval.  Hanley Hays, RN 04/20/2015, 11:40 AM

## 2015-04-21 DIAGNOSIS — L8990 Pressure ulcer of unspecified site, unspecified stage: Secondary | ICD-10-CM | POA: Insufficient documentation

## 2015-04-21 DIAGNOSIS — J9621 Acute and chronic respiratory failure with hypoxia: Secondary | ICD-10-CM | POA: Diagnosis not present

## 2015-04-21 LAB — TYPE AND SCREEN
ABO/RH(D): O POS
ANTIBODY SCREEN: NEGATIVE
UNIT DIVISION: 0
UNIT DIVISION: 0
Unit division: 0
Unit division: 0

## 2015-04-21 LAB — BASIC METABOLIC PANEL
Anion gap: 6 (ref 5–15)
BUN: 23 mg/dL — ABNORMAL HIGH (ref 6–20)
CALCIUM: 8.1 mg/dL — AB (ref 8.9–10.3)
CO2: 31 mmol/L (ref 22–32)
Chloride: 99 mmol/L — ABNORMAL LOW (ref 101–111)
Creatinine, Ser: 1.19 mg/dL — ABNORMAL HIGH (ref 0.44–1.00)
GFR calc non Af Amer: 39 mL/min — ABNORMAL LOW (ref >60)
GFR, EST AFRICAN AMERICAN: 46 mL/min — AB (ref >60)
Glucose, Bld: 111 mg/dL — ABNORMAL HIGH (ref 65–99)
POTASSIUM: 3 mmol/L — AB (ref 3.5–5.1)
Sodium: 136 mmol/L (ref 135–145)

## 2015-04-21 LAB — CBC
HCT: 26.2 % — ABNORMAL LOW (ref 36.0–46.0)
Hemoglobin: 8.3 g/dL — ABNORMAL LOW (ref 12.0–15.0)
MCH: 27.5 pg (ref 26.0–34.0)
MCHC: 31.7 g/dL (ref 30.0–36.0)
MCV: 86.8 fL (ref 78.0–100.0)
PLATELETS: 210 10*3/uL (ref 150–400)
RBC: 3.02 MIL/uL — ABNORMAL LOW (ref 3.87–5.11)
RDW: 18.9 % — ABNORMAL HIGH (ref 11.5–15.5)
WBC: 6.9 10*3/uL (ref 4.0–10.5)

## 2015-04-21 LAB — PHOSPHORUS: Phosphorus: 3.2 mg/dL (ref 2.5–4.6)

## 2015-04-21 LAB — MAGNESIUM: Magnesium: 2.3 mg/dL (ref 1.7–2.4)

## 2015-04-21 MED ORDER — FUROSEMIDE 10 MG/ML IJ SOLN
20.0000 mg | Freq: Two times a day (BID) | INTRAMUSCULAR | Status: DC
  Administered 2015-04-22: 20 mg via INTRAVENOUS
  Filled 2015-04-21 (×2): qty 2

## 2015-04-21 MED ORDER — SENNOSIDES-DOCUSATE SODIUM 8.6-50 MG PO TABS
1.0000 | ORAL_TABLET | Freq: Two times a day (BID) | ORAL | Status: DC
  Administered 2015-04-21 – 2015-04-22 (×2): 1 via ORAL
  Filled 2015-04-21 (×2): qty 1

## 2015-04-21 MED ORDER — CARBOXYMETHYLCELLULOSE SODIUM 1 % OP SOLN: 1.0000 [drp] | Freq: Two times a day (BID) | OPHTHALMIC | Status: DC

## 2015-04-21 MED ORDER — POLYVINYL ALCOHOL 1.4 % OP SOLN
1.0000 [drp] | Freq: Two times a day (BID) | OPHTHALMIC | Status: DC
  Administered 2015-04-21 – 2015-04-22 (×2): 1 [drp] via OPHTHALMIC
  Filled 2015-04-21: qty 15

## 2015-04-21 NOTE — Progress Notes (Signed)
Speech Language Pathology Treatment: Dysphagia  Patient Details Name: Xitlalic Maslin MRN: [id number] DOB: 02/26/1923 Today's Date: 04/21/2015 Time: 1324-4010 SLP Time Calculation (min) (ACUTE ONLY): 10 min  Assessment / Plan / Recommendation Clinical Impression  Pt consumed DYs 2 textures and thin liquids with Mod I, without overt signs of aspiration. Mastication is sufficient although very mildly prolonged, and pt says her current meals have been appropriate textures overall. Would continue current diet for now.   HPI Other Pertinent Information: Pt is an 81 year old female with h/o DM, HFpEF, Afib (on clopidogrel and apixaban) , CAD, pulmonary hypertension and chronic hypoxic respiratory failure requiring 2-3L at home. She was recently d/ced from SNF (7/20) and was told on d/c that she has pneumonia that they would treat as an outpt. Upon arrival home she remained persistently SOB with DOE and orthopnea. Yesterday she devoloped some significant palpitation that led her to come to the ED, where she was found to be in Afib with RVR, hypertensive with a new anemia from April (10.2->6.7). Since admission pt with fluctuating respiratory support with RT placing on and off Bipap.  RN reports patient choking on diet this am with difficulty with solids. ST consult to evaluate swallowing.  Chest x ray 04-18-15 shows Small residual focal opacity in the right mid lung field and small bilateral pleural effusions   Pertinent Vitals Pain Assessment: No/denies pain  SLP Plan  Continue with current plan of care    Recommendations Diet recommendations: Dysphagia 2 (fine chop);Thin liquid Liquids provided via: Cup;Straw Medication Administration: Whole meds with liquid Supervision: Patient able to self feed;Intermittent supervision to cue for compensatory strategies Compensations: Minimize environmental distractions;Small sips/bites;Slow rate;Check for pocketing;Follow solids with liquid Postural Changes and/or  Swallow Maneuvers: Seated upright 90 degrees;Upright 30-60 min after meal       Oral Care Recommendations: Oral care BID Plan: Continue with current plan of care    Maxcine Ham, M.A. CCC-SLP [phone]  Maxcine Ham 04/21/2015, 3:57 PM

## 2015-04-21 NOTE — Clinical Social Work Note (Signed)
Clinical Social Work Assessment  Patient Details  Name: Janet Bond MRN: [id number] Date of Birth: Mar 22, 1923  Date of referral:  04/20/15               Reason for consult:  Facility Placement                Permission sought to share information with:  Family Supports, Customer service manager Permission granted to share information::  Yes, Verbal Permission Granted  Name::     [facility] SNFs and 2 granddaughters- Janet Bond [place] and Janet Bond  [phone]        Housing/Transportation Living arrangements for the past 2 months:  [place] (Recently d/c'd 04/15/15 from [facility]- home with Granddaughter) Source of Information:  Power of Forensic psychologist, Other (Comment Required), Patient (2 granddaughters) Patient Interpreter Needed:  None Criminal Activity/Legal Involvement Pertinent to Current Situation/Hospitalization:  No - Comment as needed Significant Relationships:  Other(Comment) (2 granddaughters) Lives with:  Relatives Do you feel safe going back to the place where you live?  Yes (Agrees to SNF) Need for family participation in patient care:  Yes (Comment)  Care giving concerns:  Patient's 2 granddaughters strongly feel that patient cannot be managed at home as she was recently d/c'd from [facility] to the care of [place] who was unable to manage her care at home and patient had to return to the hospital.   Patient defers to her granddaughter in determining need for SNF.  Social Worker assessment / plan:  81 year old female- recently a resident of [facility] and [place]. She was d/c'd home on 04/15/15 after which time facility states that patient's insurance stated patient no longer met SNF criteria.  Family appealed decision and [employer] upheld d/c decision. [name] brought patient home but found that she was very short of breath and could not manage her care at home.  Called 911 and patient was brought to the hospital.  Patient has had multiple admits to [facility] recently- however- per Elio Forget- Admissions at the SNF- patient still has co-insurance days for SNF. She states that the SNF did discuss family applying for Medicaid for long term care for patient but were informed that patient would not be approved.  CSW discussed with both granddaughters who deny being approached by facility re: Medicaid- however [name] stated that she tried to get Medicaid for patient in the past and was denied.  Upon further discussion and questioning- it appears that she applied for Special Assistance for [place] and patient's income is too high. CSW discussed need to apply for Long Term Care Medicaid asap with the Department of Social Services. The granddaughters verbalized understanding of same and will go to DSS. Granddaughter has original Hospital doctor. Copy made for chart.    Employment status:  Retired Nurse, adult PT Recommendations:  Not assessed at this time [place] / Referral to community resources:  [place]  Patient/Family's Response to care:  Patient noted to be very sleepy and quiet during the interview. When spoken to- she would respond quietly but stated that she wants granddaughters- Janet Bond and Janet Bond to make all decisions for her and she defers to their d/c choices. She stated that she trusts them to take care of her and does not want any other family members involved in this decision making. Patient confirmed that she wants Janet Bond to be her legal  Power of [employer].    Patient/Family's Understanding of and Emotional Response to Diagnosis, Current Treatment, and Prognosis:  Patient relates "I'm ok with going to a nursing home if that's where I need to bed."  She was calm, relaxed and quiet. She stated that she did not feel that she did well at home."  [name] very emotional during today's assessment and both she and  Janet Bond verbalized feelings of being overwhelmed with attempts at care giving efforts. Neither feels that they can manage patient's care at home.    Emotional Assessment Appearance:  Appears stated age Attitude/Demeanor/Rapport:   (Quiet, sleepy, cooperative when aroused for questions) Affect (typically observed):  Quiet, Calm, Appropriate Orientation:  Fluctuating Orientation (Suspected and/or reported Sundowners), Oriented to Self, Oriented to Place Alcohol / Substance use:  Never Used Psych involvement (Current and /or in the community):  No (Comment)  Discharge Needs  Concerns to be addressed:  Care Coordination, Home Safety Concerns, Discharge Planning Concerns (Failure to thrive at home in care of granddaughters) Readmission within the last 30 days:  No Current discharge risk:  Other (DC from SNF 2 days prior to re-hospitalization) Barriers to Discharge:  Continued Medical Work up   Janet Bond 04/20/2015   6:30 PM

## 2015-04-21 NOTE — Care Management Important Message (Signed)
Important Message  Patient Details  Name: Janet Bond MRN: [id number] Date of Birth: 1923-06-30   Medicare Important Message Given:  Yes-second notification given    Orson Aloe 04/21/2015, 2:10 PM

## 2015-04-21 NOTE — Evaluation (Signed)
Physical Therapy Evaluation Patient Details Name: Janet Bond MRN: [id number] DOB: Nov 08, 1922 Today's Date: 04/21/2015   History of Present Illness  Pt is an 81 year old female with h/o DM, HFpEF, Afib (on clopidogrel and apixaban) , CAD, pulmonary hypertension and chronic hypoxic respiratory failure requiring 2-3L at home. She was recently d/ced from SNF (7/20) and was told on d/c that she has pneumonia that they would treat as an outpt. Upon arrival home she remained persistently SOB with DOE and orthopnea.She devoloped some significant palpitation that led her to come to the ED, where she was found to be in Afib with RVR, hypertensive with a new anemia from April (10.2->6.7). Since admission pt with fluctuating respiratory support with RT placing on and off Bipap.  Clinical Impression  Pt admitted with above diagnosis. Pt currently with functional limitations due to the deficits listed below (see PT Problem List).  Pt will benefit from skilled PT to increase their independence and safety with mobility to allow discharge to the venue listed below.       Follow Up Recommendations Home health PT;Supervision/Assistance - 24 hour    Equipment Recommendations  3in1 (PT)    Recommendations for Other Services OT consult     Precautions / Restrictions Precautions Precautions: Fall      Mobility  Bed Mobility Overal bed mobility: Needs Assistance Bed Mobility: Supine to Sit     Supine to sit: Mod assist     General bed mobility comments: Cues for technqiue; mod assist to bring trunk upright and help LEs off of the bed  Transfers Overall transfer level: Needs assistance Equipment used: Rolling walker (2 wheeled) Transfers: Sit to/from Stand Sit to Stand: Min guard (with physical contact)         General transfer comment: Cues for hand placement; Close guard for safety, but did not need physical assist  Ambulation/Gait Ambulation/Gait assistance: Min guard Ambulation  Distance (Feet):  (pivotal steps bed to [facility] to chair) Assistive device: Rolling walker (2 wheeled) Gait Pattern/deviations: Shuffle;Decreased step length - right;Decreased step length - left;Trunk flexed     General Gait Details: Close guard for safety  Stairs            Wheelchair Mobility    Modified Rankin (Stroke Patients Only)       Balance Overall balance assessment: Needs assistance Sitting-balance support: No upper extremity supported Sitting balance-Leahy Scale: Good     Standing balance support: Bilateral upper extremity supported Standing balance-Leahy Scale: Poor                               Pertinent Vitals/Pain Pain Assessment: No/denies pain    Home Living Family/patient expects to be discharged to:: Private residence Living Arrangements: Children;Other relatives (Grandkids) Available Help at Discharge: Family;Available 24 hours/day (per pt) Type of Home: House Home Access: Stairs to enter Entrance Stairs-Rails: None Entrance Stairs-Number of Steps: 1 Home Layout: One level Home Equipment: Walker - 2 wheels      Prior Function Level of Independence: Independent with assistive device(s)               Hand Dominance        Extremity/Trunk Assessment   Upper Extremity Assessment: Generalized weakness           Lower Extremity Assessment: Generalized weakness      Cervical / Trunk Assessment: Kyphotic  Communication   Communication: HOH  Cognition Arousal/Alertness: Awake/alert Behavior During  Therapy: WFL for tasks assessed/performed Overall Cognitive Status: Within Functional Limits for tasks assessed                      General Comments      Exercises        Assessment/Plan    PT Assessment Patient needs continued PT services  PT Diagnosis Difficulty walking;Generalized weakness   PT Problem List Decreased strength;Decreased range of motion;Decreased activity tolerance;Decreased  balance;Decreased mobility;Decreased knowledge of use of DME;Decreased knowledge of precautions  PT Treatment Interventions DME instruction;Gait training;Stair training;Functional mobility training;Therapeutic activities;Therapeutic exercise;Balance training;Patient/family education   PT Goals (Current goals can be found in the Care Plan section) Acute Rehab PT Goals Patient Stated Goal: to get better PT Goal Formulation: With patient Time For Goal Achievement: 05/05/15 Potential to Achieve Goals: Good    Frequency Min 3X/week   Barriers to discharge        Co-evaluation               End of Session Equipment Utilized During Treatment: Gait belt Activity Tolerance: Patient tolerated treatment well Patient left: in chair;with call bell/phone within reach Nurse Communication: Mobility status         Time: 7829-5621 PT Time Calculation (min) (ACUTE ONLY): 25 min   Charges:   PT Evaluation $Initial PT Evaluation Tier I: 1 Procedure PT Treatments $Therapeutic Activity: 8-22 mins   PT G Codes:        Olen Pel 04/21/2015, 12:57 PM  Van Clines, PT  Acute Rehabilitation Services Pager [phone] Office [phone]

## 2015-04-21 NOTE — Progress Notes (Addendum)
Patient ID: Janet Bond, female   DOB: March 23, 1923, 81 y.o.   MRN: [id number] [facility] PROGRESS NOTE  Janet Bond WUJ:[id number] DOB: 1923-04-20 DOA: 04/17/2015 PCP: No primary care provider on file.   Brief narrative:    22F with h/o DM, HFpEF, Afib (on clopidogrel and apixaban) , CAD, pulmonary hypertension and chronic hypoxic respiratory failure requiring 2-3L at home. She was recently d/ced from SNF (7/20) and was told on d/c that she has pneumonia that they would treat as an outpt. Upon arrival home she remained persistently SOB with DOE and orthopnea.   In ED, pt found to be in Afib with RVR, hypertensive with a new anemia from April (10.2->6.7).Pt was under PCCM care until 7/26 when [facility] took over.   Assessment/Plan:    Acute on chronic hypoxemic respiratory failure - Likely multifactorial including acute anemia, decompensated HF, pleural effusions and pulmonary hypertension  - Diuresis with lasix 40 mg IV q8 x2 doses on 7/25 per PCCM - weight is still up: 132 on admission and up to 137 lbs this AM - will continue lasix 20 mg BID for now and monitor clinical response   Right mid lung opacity - family does not wish to proceed with further work up and understand could be malignancy   Afib with RVR likely 2/2 volume overload and decompensated CHF with component of anemia - CHADS2 >3  - with acute hemaglobin drop, Plavix and Apixiban have been on hold  - possibly resume once Hg remains stable at baseline ~10, re evaluate prior to discharge if ok to restart  - diuresis as noted above  - continue Coreg 6.25 mg BID and Cardizem 60 mg Q6 hours (can transition to 240 mg QD in next 24 hours if HR stable on this regimen)   Elevated troponins - secondary to demand ischemia  - no chest pain this AM  Acute renal failure, hypokalemia  - Likely cardiorenal in the setting of decompensated CHF. - Cr continues trending down  - BMET in AM. - Replace electrolytes as indicated, K is  still low   Acute on chronic blood loss anemia, IDA - Hg drop while inpatient with FOBT + and concern of GIB - Plavix and Apixiban on hold as noted above  - continue Protonix BID for now - if further drop in Hg noted, consider GI consult  - transfuse if Hg < 7 - continue iron supplementation   Moderate PCM - nutritionist consulted   DVT prophylaxis - SCD's  Code Status: DNR Family Communication:  plan of care discussed with the patient Disposition Plan: Physical therapy recommends HH PT  IV access:  Peripheral IV  Procedures and diagnostic studies:    Dg Chest 2 View 04/18/2015  Interval significant improvement in previously seen diffuse airspace opacities. Small residual focal opacity in the right mid lung field.  Small bilateral pleural effusions.    Dg Chest Port 1 View 04/20/2015  Allowing for the differences in positioning there has not been dramatic interval change since the study of July 23rd. There is persistent mild pulmonary interstitial edema, small bilateral pleural effusions, and an area of confluent airspace opacity in the right mid lung which could reflect pneumonia but neoplasm is not excluded.  Chest CT scanning would be useful to further evaluate the right lung.    Medical Consultants:  None  Other Consultants:  PT - HH PT recommended  SLP - dys II diet  IAnti-Infectives:   None  MAGICK-Bryttney Netzer, MD  [facility] Pager  [phone]  If 7PM-7AM, please contact night-coverage [email or web address] Password [facility] 04/21/2015, 4:33 PM   LOS: 3 days   HPI/Subjective: No events overnight.   Objective: Filed Vitals:   04/20/15 2144 04/21/15 0133 04/21/15 0433 04/21/15 1023  BP:   132/76 144/55  Pulse:   103 89  Temp:   98.2 F (36.8 C) 98.3 F (36.8 C)  TempSrc:   Oral Oral  Resp:   20 18  Height:      Weight: 61.689 kg (136 lb) 61.689 kg (136 lb)    SpO2:   100% 100%    Intake/Output Summary (Last 24 hours) at 04/21/15 1633 Last data filed at 04/21/15 1230   Gross per 24 hour  Intake   1080 ml  Output   1100 ml  Net    -20 ml    Exam:   General:  Pt is alert, follows commands appropriately, not in acute distress  Cardiovascular: Regular rate and rhythm, no rubs, no gallops  Respiratory: Clear to auscultation bilaterally, diminished breat sounds at bases   Abdomen: Soft, non tender, non distended, bowel sounds present, no guarding  Extremities: pulses DP and PT palpable bilaterally  Data Reviewed: Basic Metabolic Panel:  Recent Labs Lab 04/18/15 0103 04/18/15 0355 04/19/15 04/20/15 0449 04/21/15 0513  NA 139  --  136 136 136  K 4.9  --  4.2 4.5 3.0*  CL 106  --  102 101 99*  CO2 23  --  25 26 31   GLUCOSE 129*  --  137* 130* 111*  BUN 30*  --  30* 29* 23*  CREATININE 1.45*  --  1.40* 1.26* 1.19*  CALCIUM 8.9  --  8.5* 6.9* 8.1*  MG  --  2.2 1.9 1.5* 2.3  PHOS  --  4.1 5.5* 4.5 3.2   Liver Function Tests:  Recent Labs Lab 04/18/15 0355  AST 104*  ALT 70*  ALKPHOS 72  BILITOT 1.0  PROT 6.2*  ALBUMIN 2.8*   CBC:  Recent Labs Lab 04/19/15 1215 04/19/15 1820 04/20/15 04/20/15 0449 04/21/15 0513  WBC 6.8 7.0 6.8 7.2 6.9  HGB 8.8* 8.8* 8.8* 8.7* 8.3*  HCT 27.9* 27.3* 26.7* 27.2* 26.2*  MCV 85.1 85.3 83.7 85.0 86.8  PLT 237 226 243 227 210   Cardiac Enzymes:  Recent Labs Lab 04/18/15 0103 04/18/15 0355 04/18/15 1345 04/18/15 1542  TROPONINI 0.03 0.04* 0.04* 0.04*     Recent Results (from the past 240 hour(s))  MRSA PCR Screening     Status: None   Collection Time: 04/18/15  4:25 AM  Result Value Ref Range Status   MRSA by PCR NEGATIVE NEGATIVE Final    Comment:        The GeneXpert MRSA Assay (FDA approved for NASAL specimens only), is one component of a comprehensive MRSA colonization surveillance program. It is not intended to diagnose MRSA infection nor to guide or monitor treatment for MRSA infections.      Scheduled Meds: . antiseptic oral rinse  7 mL Mouth Rinse q12n4p  .  atorvastatin  40 mg Oral q1800  . carboxymethylcellulose  1 drop Both Eyes BID  . carvedilol  6.25 mg Oral BID WC  . chlorhexidine  15 mL Mouth Rinse BID  . diltiazem  60 mg Oral 4 times per day  . docusate sodium  100 mg Oral BID  . ferrous sulfate  325 mg Oral Q breakfast  . megestrol  400 mg Oral Daily  . pantoprazole (PROTONIX) IV  40 mg Intravenous Q12H  . polyethylene glycol  17 g Oral Daily  . senna  1 tablet Oral QHS  . senna-docusate  1 tablet Oral BID   Continuous Infusions:

## 2015-04-22 ENCOUNTER — Other Ambulatory Visit: Payer: Self-pay

## 2015-04-22 DIAGNOSIS — I482 Chronic atrial fibrillation: Secondary | ICD-10-CM

## 2015-04-22 DIAGNOSIS — L8990 Pressure ulcer of unspecified site, unspecified stage: Secondary | ICD-10-CM

## 2015-04-22 DIAGNOSIS — E43 Unspecified severe protein-calorie malnutrition: Secondary | ICD-10-CM

## 2015-04-22 DIAGNOSIS — J9600 Acute respiratory failure, unspecified whether with hypoxia or hypercapnia: Secondary | ICD-10-CM

## 2015-04-22 DIAGNOSIS — K922 Gastrointestinal hemorrhage, unspecified: Secondary | ICD-10-CM

## 2015-04-22 LAB — BASIC METABOLIC PANEL
Anion gap: 3 — ABNORMAL LOW (ref 5–15)
BUN: 25 mg/dL — ABNORMAL HIGH (ref 6–20)
CALCIUM: 8.2 mg/dL — AB (ref 8.9–10.3)
CHLORIDE: 100 mmol/L — AB (ref 101–111)
CO2: 32 mmol/L (ref 22–32)
Creatinine, Ser: 1.29 mg/dL — ABNORMAL HIGH (ref 0.44–1.00)
GFR calc non Af Amer: 36 mL/min — ABNORMAL LOW (ref >60)
GFR, EST AFRICAN AMERICAN: 41 mL/min — AB (ref >60)
GLUCOSE: 96 mg/dL (ref 65–99)
Potassium: 3.6 mmol/L (ref 3.5–5.1)
SODIUM: 135 mmol/L (ref 135–145)

## 2015-04-22 LAB — CBC
HEMATOCRIT: 25.4 % — AB (ref 36.0–46.0)
HEMOGLOBIN: 8.1 g/dL — AB (ref 12.0–15.0)
MCH: 28 pg (ref 26.0–34.0)
MCHC: 31.9 g/dL (ref 30.0–36.0)
MCV: 87.9 fL (ref 78.0–100.0)
PLATELETS: 202 10*3/uL (ref 150–400)
RBC: 2.89 MIL/uL — AB (ref 3.87–5.11)
RDW: 18.8 % — ABNORMAL HIGH (ref 11.5–15.5)
WBC: 6.6 10*3/uL (ref 4.0–10.5)

## 2015-04-22 MED ORDER — ENSURE ENLIVE PO LIQD
237.0000 mL | Freq: Two times a day (BID) | ORAL | Status: DC
  Administered 2015-04-22: 237 mL via ORAL

## 2015-04-22 MED ORDER — ENSURE ENLIVE PO LIQD
237.0000 mL | Freq: Two times a day (BID) | ORAL | Status: DC
Start: 2015-04-22 — End: 2015-05-05

## 2015-04-22 MED ORDER — SENNOSIDES-DOCUSATE SODIUM 8.6-50 MG PO TABS
1.0000 | ORAL_TABLET | Freq: Two times a day (BID) | ORAL | Status: DC
Start: 2015-04-22 — End: 2015-05-05

## 2015-04-22 MED ORDER — DILTIAZEM HCL 60 MG PO TABS: 60.0000 mg | ORAL_TABLET | Freq: Four times a day (QID) | ORAL | Status: DC

## 2015-04-22 MED ORDER — TRAMADOL HCL 50 MG PO TABS: ORAL_TABLET | ORAL | Status: DC

## 2015-04-22 MED ORDER — MENTHOL 3 MG MT LOZG: 1.0000 | LOZENGE | OROMUCOSAL | Status: DC | PRN

## 2015-04-22 NOTE — Progress Notes (Signed)
Chaplain responded to Nurse  Chaplain learned that Dorough wanted to talk with her granddaughter and get stronger so she go home.  Chaplain spoke with nurse and they are going to get in touch with her granddaughter.   04/22/15 1200  Clinical Encounter Type  Visited With Patient;Health care provider  Visit Type Initial;Spiritual support  Referral From Nurse  Consult/Referral To Chaplain  Spiritual Encounters  Spiritual Needs Emotional

## 2015-04-22 NOTE — Progress Notes (Signed)
Attempted to call [facility] SNF twice [phone]), no answer.  Peri Maris, MBA, BS, RN

## 2015-04-22 NOTE — Clinical Social Work Placement (Signed)
   CLINICAL SOCIAL WORK PLACEMENT  NOTE  Date:  04/22/2015  Patient Details  Name: Janet Bond MRN: [id number] Date of Birth: October 25, 1922  Clinical Social Work is seeking post-discharge placement for this patient at the Skilled  Nursing Facility level of care (*CSW will initial, date and re-position this form in  chart as items are completed):  Yes   Patient/family provided with [facility] Clinical Social Work Department's list of facilities offering this level of care within the geographic area requested by the patient (or if unable, by the patient's family).  Yes   Patient/family informed of their freedom to choose among providers that offer the needed level of care, that participate in Medicare, Medicaid or managed care program needed by the patient, have an available bed and are willing to accept the patient.  Yes   Patient/family informed of [facility]'s ownership interest in [facility] and [facility], as well as of the fact that they are under no obligation to receive care at these facilities.  PASRR submitted to EDS on       PASRR number received on       Existing PASRR number confirmed on 04/22/15     FL2 transmitted to all facilities in geographic area requested by pt/family on 04/20/15     FL2 transmitted to all facilities within larger geographic area on       Patient informed that his/her managed care company has contracts with or will negotiate with certain facilities, including the following:        Yes   Patient/family informed of bed offers received.  Patient chooses bed at [facility]     Physician recommends and patient chooses bed at      Patient to be transferred to [place] on 04/22/15.  Patient to be transferred to facility by Ambulance [facility] Mons)     Patient family notified on 04/22/15 of transfer.  Name of family member notified:  Tifany Hirsch, granddaughter [phone])     PHYSICIAN       Additional Comment:     _______________________________________________ Cristobal Goldmann, LCSW 04/22/2015, 2:57 PM

## 2015-04-22 NOTE — Discharge Instructions (Signed)

## 2015-04-22 NOTE — Progress Notes (Signed)
Patient discharged to snf [facility]. NSL removed with catheter intact. Foley discontinued. [facility] transported to snf. Attempt to contact snf for report, no answer, [facility]link informed. Granddaughter at bedside at time of departure. Bess Kinds, RN

## 2015-04-22 NOTE — Care Management Note (Signed)
Case Management Note  Patient Details  Name: Janet Bond MRN: [id number] Date of Birth: 1922/10/30  Subjective/Objective:            CM following for progression and d/c needs.         Action/Plan: Plan for d/c to home with [facility] services, however granddaughters are unable to provide 24/7 care needed and pt would benefit from rehab. CSW working with pt re d/c to SNF.   Expected Discharge Date:       04/23/2015           Expected Discharge Plan:  Skilled Nursing Facility  In-House Referral:  Clinical Social Work  Discharge planning Services  CM Consult  Post Acute Care Choice:    Choice offered to:     DME Arranged:    DME Agency:     HH Arranged:    HH Agency:     Status of Service:  Completed, signed off  Medicare Important Message Given:  Yes-second notification given Date Medicare IM Given:    Medicare IM give by:    Date Additional Medicare IM Given:    Additional Medicare Important Message give by:     If discussed at Long Length of Stay Meetings, dates discussed:    Additional Comments:  Starlyn Skeans, RN 04/22/2015, 12:22 PM

## 2015-04-22 NOTE — Telephone Encounter (Signed)
Rx faxed to [facility] @ [phone], phone number [phone]  

## 2015-04-22 NOTE — Progress Notes (Signed)
Initial Nutrition Assessment  DOCUMENTATION CODES:   Severe malnutrition in context of chronic illness  INTERVENTION:   Provide vanilla Ensure Enlive po BID, each supplement provides 350 kcal and 20 grams of protein.  Encourage adequate PO intake.   NUTRITION DIAGNOSIS:   Malnutrition related to chronic illness as evidenced by severe depletion of body fat, severe depletion of muscle mass.  GOAL:   Patient will meet greater than or equal to 90% of their needs  MONITOR:   PO intake, Supplement acceptance, Weight trends, Labs, I & O's  REASON FOR ASSESSMENT:   Consult Assessment of nutrition requirement/status  ASSESSMENT:   44F with h/o DM, HFpEF, Afib (on clopidogrel and apixaban) , CAD, pulmonary hypertension and chronic hypoxic respiratory failure requiring 2-3L at home. She was recently d/ced from SNF (7/20) and was told on d/c that she has pneumonia that they would treat as an outpt. Upon arrival home she remained persistently SOB with DOE and orthopnea. Yesterday she devoloped some significant palpitation that led her to come to the ED, where she was found to be in Afib with RVR, hypertensive with a new anemia from April (10.2->6.7).  Pt reports appetite has been improving. Current meal completion has been 0-50%. Pt reports PTA she has been trying to consuming 3 meals daily, however has been eating 2 meals daily as well as drinking one Boost daily. Weight has been stable. RD to order Ensure to aid in caloric and protein needs. Pt was encouraged to eat her food at meals and to drink her supplements.   Nutrition-Focused physical exam completed. Findings are severe fat depletion, severe muscle depletion, and severe edema.   Labs: Low chloride, calcium, and GFR. High BUN and creatinine.   Diet Order:  DIET DYS 2 Room service appropriate?: Yes; Fluid consistency:: Thin  Skin:  Wound (see comment) (Stage II pressure ulcer on coccyx, +3 LE edema)  Last BM:   7/26  Height:   Ht Readings from Last 1 Encounters:  04/18/15 5' (1.524 m)    Weight:   Wt Readings from Last 1 Encounters:  04/22/15 137 lb 9.1 oz (62.4 kg)   Wt Readings from Last 10 Encounters:  04/22/15 137 lb 9.1 oz (62.4 kg)  01/29/15 127 lb 6.4 oz (57.788 kg)  01/19/15 121 lb 8 oz (55.112 kg)  09/07/12 140 lb (63.504 kg)    Ideal Body Weight:  45.45 kg  BMI:  Body mass index is 26.87 kg/(m^2).  Estimated Nutritional Needs:   Kcal:  1600-1800  Protein:  65-80 grams  Fluid:  1.6 - 1.8 L/day  EDUCATION NEEDS:   No education needs identified at this time  Roslyn Smiling, MS, RD, LDN Pager # [phone] After hours/ weekend pager # [phone]

## 2015-04-22 NOTE — Discharge Summary (Addendum)
Physician Discharge Summary  Janet Bond ZOX:[id number] DOB: 1923/02/03 DOA: 04/17/2015  PCP: No primary care provider on file.  Admit date: 04/17/2015 Discharge date: 04/22/2015  Time spent: 45 minutes  Recommendations for Outpatient Follow-up:  Patient will be discharged to [facility].  Patient will need to continue physicals as well as occupational therapy is recommended by the facility.  Patient will need to follow up with primary care provider within one week of discharge, and have repeat CBC and BMP.  Patient should continue medications as prescribed.  Patient should follow a dysphagia 2 diet.  Would recommend follow up with cardiologist to discuss continuation of plavix and Eliquis in the setting of GI bleeding.  Patient may also benefit from outpatient gastroenterology follow up.    Discharge Diagnoses:  Acute on chronic hypoxemic respiratory failure Right mid lung opacity Afib with RVR likely 2/2 volume overload and decompensated CHF with component of anemia Elevated troponins Acute renal failure/hypokalemia  Acute on chronic blood loss anemia, IDA Moderate PCM  Discharge Condition: Stable  Diet recommendation: Dysphasia 2  Filed Weights   04/21/15 0133 04/21/15 2019 04/22/15 0448  Weight: 61.689 kg (136 lb) 62.4 kg (137 lb 9.1 oz) 62.4 kg (137 lb 9.1 oz)    History of present illness:  On 04/19/2015 by Dr. Cleone Slim, PCCM [phone] with h/o DM, HFpEF, Afib (on clopidogrel and apixaban) , CAD, pulmonary hypertension and chronic hypoxic respiratory failure requiring 2-3L at home. She was recently d/ced from SNF (7/20) and was told on d/c that she has pneumonia that they would treat as an outpt. Upon arrival home she remained persistently SOB with DOE and orthopnea. Yesterday she devoloped some significant palpitation that led her to come to the ED, where she was found to be in Afib with RVR, hypertensive with a new anemia from April (10.2->6.7). She reports  dark Tarry stool at home but her granddaughter stated her last BM at home was normal colored. She also reports increased LE edema compared to usual.  Hospital Course:  Acute on chronic hypoxemic respiratory failure -Improving, Likely multifactorial including acute anemia, decompensated HF, pleural effusions and pulmonary hypertension  - Diuresis with lasix 40 mg IV q8 x2 doses on 7/25 per PCCM - will continue lasix PO  Right mid lung opacity - family does not wish to proceed with further work up and understand could be malignancy   Afib with RVR likely 2/2 volume overload and decompensated CHF with component of anemia - CHADS2 >3  - with acute hemaglobin drop, Plavix and Apixiban have been on hold- restarting should be discussed with cardiologist in [facility].   -Discussed this with patient's granddaughter, Janet Bond - continue Coreg 6.25 mg BID and Cardizem 60 mg Q6 hours (can transition to 240 mg QD in next 24 hours if HR stable on this regimen)   Acute diastolic CHF -Last echocardiogram 12/2014: EF 60-65%, Grade 2 diastolic dysfunction -Continue treatment and plan as above. -Monitored daily weights, intake and output  Elevated troponins - secondary to demand ischemia  - no chest pain   Acute renal failure/hypokalemia  - Likely cardiorenal in the setting of decompensated CHF. - Cr continues trending down  - Suspect baseline 1.2  Acute on chronic blood loss anemia, IDA - Hg drop while inpatient with FOBT + and concern of GIB - Plavix and Apixiban on hold as noted above  - continue Protonix BID for now - continue iron supplementation  - obtain CBC in one week of  discharge - Outpatient GI follow up  Moderate PCM - nutritionist consulted   Diabetes mellitus, type 2 -HbA1c 5.9 (01/18/2015) -Metformin held -Recommend speaking with PCP regarding restarting metformin, given kidney function  Procedures: None  Consultations: PCCM  Discharge Exam: Filed Vitals:    04/22/15 0909  BP: 120/58  Pulse: 93  Temp: 98.2 F (36.8 C)  Resp: 18     General: Well developed, well nourished, NAD, appears stated age  HEENT: NCAT,mucous membranes moist.  Cardiovascular: S1 S2 auscultated, irregular   Respiratory: Diminished however Clear  Abdomen: Soft, nontender, nondistended, + bowel sounds  Extremities: warm dry without cyanosis clubbing. Trace LE edema  Discharge Instructions      Discharge Instructions    Discharge instructions    Complete by:  As directed   Patient will be discharged to [facility] nursing facility.  Patient will need to continue physicals as well as occupational therapy is recommended by the facility.  Patient will need to follow up with primary care provider within one week of discharge, and have repeat CBC.  Patient should continue medications as prescribed.  Patient should follow a dysphagia 2 diet.  Would recommend follow up with cardiologist to discuss continuation of plavix and Eliquis in the setting of GI bleeding.  Patient may also benefit from outpatient gastroenterology follow up.            Medication List    STOP taking these medications        clopidogrel 75 MG tablet  Commonly known as:  PLAVIX     diltiazem 180 MG 24 hr capsule  Commonly known as:  DILACOR XR  Replaced by:  diltiazem 60 MG tablet     ELIQUIS 2.5 MG Tabs tablet  Generic drug:  apixaban     levofloxacin 250 MG tablet  Commonly known as:  LEVAQUIN     lisinopril 5 MG tablet  Commonly known as:  PRINIVIL,ZESTRIL     losartan 100 MG tablet  Commonly known as:  COZAAR      TAKE these medications        amLODipine 5 MG tablet  Commonly known as:  NORVASC  Take 5 mg by mouth daily.     atorvastatin 40 MG tablet  Commonly known as:  LIPITOR  Take 1 tablet (40 mg total) by mouth daily at 6 PM.     carvedilol 12.5 MG tablet  Commonly known as:  COREG  Take 1 tablet (12.5 mg total) by mouth 2 (two) times daily with a meal.      diltiazem 60 MG tablet  Commonly known as:  CARDIZEM  Take 1 tablet (60 mg total) by mouth every 6 (six) hours.     ergocalciferol 50000 UNITS capsule  Commonly known as:  VITAMIN D2  Take 50,000 Units by mouth once a week.     feeding supplement (ENSURE ENLIVE) Liqd  Take 237 mLs by mouth 2 (two) times daily between meals.     ferrous sulfate 325 (65 FE) MG tablet  Take 325 mg by mouth daily with breakfast.     furosemide 20 MG tablet  Commonly known as:  LASIX  Take 20 mg by mouth 2 (two) times daily.     megestrol 400 MG/10ML suspension  Commonly known as:  MEGACE  Take 10 mLs by mouth daily.     menthol-cetylpyridinium 3 MG lozenge  Commonly known as:  CEPACOL  Take 1 lozenge (3 mg total) by mouth as needed for sore throat.  multivitamin tablet  Take 1 tablet by mouth daily.     nitroGLYCERIN 0.4 MG SL tablet  Commonly known as:  NITROSTAT  Place 0.4 mg under the tongue every 5 (five) minutes as needed for chest pain.     pantoprazole 40 MG tablet  Commonly known as:  PROTONIX  Take 1 tablet (40 mg total) by mouth 2 (two) times daily before a meal.     REFRESH OP  Apply 1 drop to eye 4 (four) times daily as needed (dry eyes).     senna-docusate 8.6-50 MG per tablet  Commonly known as:  Senokot-S  Take 1 tablet by mouth 2 (two) times daily.     traMADol 50 MG tablet  Commonly known as:  ULTRAM  Take one tablet by mouth three times daily as needed for pain      ASK your doctor about these medications        metFORMIN 1000 MG tablet  Commonly known as:  GLUCOPHAGE  Take 1 tablet (1,000 mg total) by mouth 2 (two) times daily with a meal. For diabetes       Allergies  Allergen Reactions  . Codeine Nausea And Vomiting  . Influenza Vaccines Nausea And Vomiting  . Aspirin Nausea And Vomiting  . Chicken Allergy Nausea And Vomiting   Follow-up Information    Follow up with Primary care physician/Cardiologist In 3 days.   Why:  Repeat CBC and BMP,  hospital followup       The results of significant diagnostics from this hospitalization (including imaging, microbiology, ancillary and laboratory) are listed below for reference.    Significant Diagnostic Studies: Dg Chest 2 View  04/18/2015   CLINICAL DATA:  81 year old female with shortness of breath  EXAM: CHEST  2 VIEW  COMPARISON:  Radiograph dated 03/10/2015  FINDINGS: There has been interval improvement of the of pulmonary opacities. A focal residual opacity is noted in the right mid lung field. Right hilar lymph node/granuloma. Small bilateral pleural effusions. Stable cardiomegaly. Degenerative changes of the spine. No acute fracture.  IMPRESSION: Interval significant improvement in previously seen diffuse airspace opacities. Small residual focal opacity in the right mid lung field.  Small bilateral pleural effusions.   Electronically Signed   By: Elgie Collard M.D.   On: 04/18/2015 01:48   Dg Chest Port 1 View  04/20/2015   CLINICAL DATA:  Chronic atrial fibrillation, coronary artery disease, aortic stenosis, diabetes.  EXAM: PORTABLE CHEST - 1 VIEW  COMPARISON:  PA and lateral chest x-ray of July 23rd 2016  FINDINGS: The lungs are adequately inflated. The interstitial markings remain increased. There is confluent alveolar opacity in the right mid lung. Slightly increased density in the retrocardiac region laterally is present today. Small bilateral pleural effusions persist. The cardiac silhouette remains enlarged. The pulmonary vascularity remains engorged. The bony thorax is unremarkable with exception of gentle dextrocurvature of the thoracic spine that may be positional.  IMPRESSION: Allowing for the differences in positioning there has not been dramatic interval change since the study of July 23rd. There is persistent mild pulmonary interstitial edema, small bilateral pleural effusions, and an area of confluent airspace opacity in the right mid lung which could reflect pneumonia but  neoplasm is not excluded.  Chest CT scanning would be useful to further evaluate the right lung.   Electronically Signed   By: David  [place] M.D.   On: 04/20/2015 07:28    Microbiology: Recent Results (from the past 240 hour(s))  MRSA PCR  Screening     Status: None   Collection Time: 04/18/15  4:25 AM  Result Value Ref Range Status   MRSA by PCR NEGATIVE NEGATIVE Final    Comment:        The GeneXpert MRSA Assay (FDA approved for NASAL specimens only), is one component of a comprehensive MRSA colonization surveillance program. It is not intended to diagnose MRSA infection nor to guide or monitor treatment for MRSA infections.      Labs: Basic Metabolic Panel:  Recent Labs Lab 04/18/15 0103 04/18/15 0355 04/19/15 04/20/15 0449 04/21/15 0513 04/22/15 0452  NA 139  --  136 136 136 135  K 4.9  --  4.2 4.5 3.0* 3.6  CL 106  --  102 101 99* 100*  CO2 23  --  25 26 31  32  GLUCOSE 129*  --  137* 130* 111* 96  BUN 30*  --  30* 29* 23* 25*  CREATININE 1.45*  --  1.40* 1.26* 1.19* 1.29*  CALCIUM 8.9  --  8.5* 6.9* 8.1* 8.2*  MG  --  2.2 1.9 1.5* 2.3  --   PHOS  --  4.1 5.5* 4.5 3.2  --    Liver Function Tests:  Recent Labs Lab 04/18/15 0355  AST 104*  ALT 70*  ALKPHOS 72  BILITOT 1.0  PROT 6.2*  ALBUMIN 2.8*   No results for input(s): LIPASE, AMYLASE in the last 168 hours. No results for input(s): AMMONIA in the last 168 hours. CBC:  Recent Labs Lab 04/19/15 1820 04/20/15 04/20/15 0449 04/21/15 0513 04/22/15 0452  WBC 7.0 6.8 7.2 6.9 6.6  HGB 8.8* 8.8* 8.7* 8.3* 8.1*  HCT 27.3* 26.7* 27.2* 26.2* 25.4*  MCV 85.3 83.7 85.0 86.8 87.9  PLT 226 243 227 210 202   Cardiac Enzymes:  Recent Labs Lab 04/18/15 0103 04/18/15 0355 04/18/15 1345 04/18/15 1542  TROPONINI 0.03 0.04* 0.04* 0.04*   BNP: BNP (last 3 results)  Recent Labs  04/18/15 0103  BNP 1250.5*    ProBNP (last 3 results) No results for input(s): PROBNP in the last 8760  hours.  CBG: No results for input(s): GLUCAP in the last 168 hours.   SignedEdsel Petrin  [facility] 04/22/2015, 2:43 PM

## 2015-04-22 NOTE — Progress Notes (Signed)
Physical Therapy Discharge Recommendation Update:  Discussed pt case with Lovette Cliche, LCSW. It appears that the patient will not have the appropriate amount of family support for safe d/c home. Based on current functional status outlined in PT Eval on 04/21/15, feel that pt could benefit from short term rehab at the SNF level prior to return home.  Conni Slipper, PT, DPT Acute Rehabilitation Services Pager: [phone]

## 2015-05-03 ENCOUNTER — Emergency Department (HOSPITAL_COMMUNITY): Payer: Medicare Other

## 2015-05-03 ENCOUNTER — Inpatient Hospital Stay (HOSPITAL_COMMUNITY)
Admission: EM | Admit: 2015-05-03 | Discharge: 2015-05-05 | DRG: 682 | Disposition: A | Discharge status: Hospice | Payer: Medicare Other | Attending: Internal Medicine | Admitting: Internal Medicine

## 2015-05-03 ENCOUNTER — Encounter (HOSPITAL_COMMUNITY): Payer: Self-pay | Admitting: Emergency Medicine

## 2015-05-03 DIAGNOSIS — Z6826 Body mass index (BMI) 26.0-26.9, adult: Secondary | ICD-10-CM

## 2015-05-03 DIAGNOSIS — E875 Hyperkalemia: Secondary | ICD-10-CM | POA: Diagnosis present

## 2015-05-03 DIAGNOSIS — Z885 Allergy status to narcotic agent status: Secondary | ICD-10-CM | POA: Diagnosis not present

## 2015-05-03 DIAGNOSIS — I272 Other secondary pulmonary hypertension: Secondary | ICD-10-CM | POA: Diagnosis present

## 2015-05-03 DIAGNOSIS — Z8673 Personal history of transient ischemic attack (TIA), and cerebral infarction without residual deficits: Secondary | ICD-10-CM | POA: Diagnosis not present

## 2015-05-03 DIAGNOSIS — J9621 Acute and chronic respiratory failure with hypoxia: Secondary | ICD-10-CM | POA: Diagnosis present

## 2015-05-03 DIAGNOSIS — R0602 Shortness of breath: Secondary | ICD-10-CM | POA: Diagnosis not present

## 2015-05-03 DIAGNOSIS — Z91018 Allergy to other foods: Secondary | ICD-10-CM

## 2015-05-03 DIAGNOSIS — I5032 Chronic diastolic (congestive) heart failure: Secondary | ICD-10-CM | POA: Diagnosis present

## 2015-05-03 DIAGNOSIS — I2510 Atherosclerotic heart disease of native coronary artery without angina pectoris: Secondary | ICD-10-CM | POA: Diagnosis present

## 2015-05-03 DIAGNOSIS — Z887 Allergy status to serum and vaccine status: Secondary | ICD-10-CM | POA: Diagnosis not present

## 2015-05-03 DIAGNOSIS — F419 Anxiety disorder, unspecified: Secondary | ICD-10-CM | POA: Diagnosis present

## 2015-05-03 DIAGNOSIS — Z79891 Long term (current) use of opiate analgesic: Secondary | ICD-10-CM | POA: Diagnosis not present

## 2015-05-03 DIAGNOSIS — Z823 Family history of stroke: Secondary | ICD-10-CM | POA: Diagnosis not present

## 2015-05-03 DIAGNOSIS — I4891 Unspecified atrial fibrillation: Secondary | ICD-10-CM | POA: Diagnosis present

## 2015-05-03 DIAGNOSIS — F22 Delusional disorders: Secondary | ICD-10-CM

## 2015-05-03 DIAGNOSIS — Z886 Allergy status to analgesic agent status: Secondary | ICD-10-CM | POA: Diagnosis not present

## 2015-05-03 DIAGNOSIS — E43 Unspecified severe protein-calorie malnutrition: Secondary | ICD-10-CM | POA: Diagnosis not present

## 2015-05-03 DIAGNOSIS — Z515 Encounter for palliative care: Secondary | ICD-10-CM

## 2015-05-03 DIAGNOSIS — Z8249 Family history of ischemic heart disease and other diseases of the circulatory system: Secondary | ICD-10-CM | POA: Diagnosis not present

## 2015-05-03 DIAGNOSIS — I252 Old myocardial infarction: Secondary | ICD-10-CM | POA: Diagnosis not present

## 2015-05-03 DIAGNOSIS — I10 Essential (primary) hypertension: Secondary | ICD-10-CM | POA: Diagnosis present

## 2015-05-03 DIAGNOSIS — I350 Nonrheumatic aortic (valve) stenosis: Secondary | ICD-10-CM | POA: Diagnosis present

## 2015-05-03 DIAGNOSIS — Z9861 Coronary angioplasty status: Secondary | ICD-10-CM | POA: Diagnosis not present

## 2015-05-03 DIAGNOSIS — E785 Hyperlipidemia, unspecified: Secondary | ICD-10-CM

## 2015-05-03 DIAGNOSIS — Z79899 Other long term (current) drug therapy: Secondary | ICD-10-CM

## 2015-05-03 DIAGNOSIS — E119 Type 2 diabetes mellitus without complications: Secondary | ICD-10-CM | POA: Diagnosis present

## 2015-05-03 DIAGNOSIS — E1159 Type 2 diabetes mellitus with other circulatory complications: Secondary | ICD-10-CM

## 2015-05-03 DIAGNOSIS — Z66 Do not resuscitate: Secondary | ICD-10-CM | POA: Diagnosis present

## 2015-05-03 DIAGNOSIS — N179 Acute kidney failure, unspecified: Secondary | ICD-10-CM | POA: Diagnosis present

## 2015-05-03 LAB — I-STAT CHEM 8, ED
BUN: 94 mg/dL — AB (ref 6–20)
CHLORIDE: 100 mmol/L — AB (ref 101–111)
Calcium, Ion: 1.21 mmol/L (ref 1.13–1.30)
Creatinine, Ser: 4.2 mg/dL — ABNORMAL HIGH (ref 0.44–1.00)
GLUCOSE: 119 mg/dL — AB (ref 65–99)
HEMATOCRIT: 28 % — AB (ref 36.0–46.0)
Hemoglobin: 9.5 g/dL — ABNORMAL LOW (ref 12.0–15.0)
POTASSIUM: 6.4 mmol/L — AB (ref 3.5–5.1)
Sodium: 131 mmol/L — ABNORMAL LOW (ref 135–145)
TCO2: 22 mmol/L (ref 0–100)

## 2015-05-03 LAB — URINALYSIS, ROUTINE W REFLEX MICROSCOPIC
Bilirubin Urine: NEGATIVE
GLUCOSE, UA: NEGATIVE mg/dL
Ketones, ur: NEGATIVE mg/dL
Nitrite: NEGATIVE
Protein, ur: 30 mg/dL — AB
Specific Gravity, Urine: 1.015 (ref 1.005–1.030)
Urobilinogen, UA: 0.2 mg/dL (ref 0.0–1.0)
pH: 5.5 (ref 5.0–8.0)

## 2015-05-03 LAB — CBC WITH DIFFERENTIAL/PLATELET
Basophils Absolute: 0 10*3/uL (ref 0.0–0.1)
Basophils Relative: 0 % (ref 0–1)
EOS ABS: 0 10*3/uL (ref 0.0–0.7)
EOS PCT: 0 % (ref 0–5)
HEMATOCRIT: 26.1 % — AB (ref 36.0–46.0)
HEMOGLOBIN: 8 g/dL — AB (ref 12.0–15.0)
Lymphocytes Relative: 6 % — ABNORMAL LOW (ref 12–46)
Lymphs Abs: 0.5 10*3/uL — ABNORMAL LOW (ref 0.7–4.0)
MCH: 26.6 pg (ref 26.0–34.0)
MCHC: 30.7 g/dL (ref 30.0–36.0)
MCV: 86.7 fL (ref 78.0–100.0)
MONO ABS: 0.3 10*3/uL (ref 0.1–1.0)
MONOS PCT: 4 % (ref 3–12)
Neutro Abs: 7.9 10*3/uL — ABNORMAL HIGH (ref 1.7–7.7)
Neutrophils Relative %: 90 % — ABNORMAL HIGH (ref 43–77)
PLATELETS: 260 10*3/uL (ref 150–400)
RBC: 3.01 MIL/uL — ABNORMAL LOW (ref 3.87–5.11)
RDW: 17 % — ABNORMAL HIGH (ref 11.5–15.5)
WBC: 8.8 10*3/uL (ref 4.0–10.5)

## 2015-05-03 LAB — COMPREHENSIVE METABOLIC PANEL
ALT: 20 U/L (ref 14–54)
ANION GAP: 7 (ref 5–15)
AST: 42 U/L — AB (ref 15–41)
Albumin: 2.7 g/dL — ABNORMAL LOW (ref 3.5–5.0)
Alkaline Phosphatase: 52 U/L (ref 38–126)
BILIRUBIN TOTAL: 0.5 mg/dL (ref 0.3–1.2)
BUN: 100 mg/dL — ABNORMAL HIGH (ref 6–20)
CHLORIDE: 100 mmol/L — AB (ref 101–111)
CO2: 25 mmol/L (ref 22–32)
Calcium: 8.7 mg/dL — ABNORMAL LOW (ref 8.9–10.3)
Creatinine, Ser: 4.01 mg/dL — ABNORMAL HIGH (ref 0.44–1.00)
GFR calc Af Amer: 10 mL/min — ABNORMAL LOW (ref >60)
GFR, EST NON AFRICAN AMERICAN: 9 mL/min — AB (ref >60)
GLUCOSE: 124 mg/dL — AB (ref 65–99)
Potassium: 6.5 mmol/L (ref 3.5–5.1)
SODIUM: 132 mmol/L — AB (ref 135–145)
TOTAL PROTEIN: 5.7 g/dL — AB (ref 6.5–8.1)

## 2015-05-03 LAB — URINE MICROSCOPIC-ADD ON

## 2015-05-03 LAB — I-STAT TROPONIN, ED: TROPONIN I, POC: 0.04 ng/mL (ref 0.00–0.08)

## 2015-05-03 LAB — BRAIN NATRIURETIC PEPTIDE: B Natriuretic Peptide: 649.7 pg/mL — ABNORMAL HIGH (ref 0.0–100.0)

## 2015-05-03 MED ORDER — NITROGLYCERIN 0.4 MG SL SUBL: 0.4000 mg | SUBLINGUAL_TABLET | SUBLINGUAL | Status: DC | PRN

## 2015-05-03 MED ORDER — LORAZEPAM 1 MG PO TABS: 1.0000 mg | ORAL_TABLET | ORAL | Status: DC | PRN

## 2015-05-03 MED ORDER — ACETAMINOPHEN 325 MG PO TABS
650.0000 mg | ORAL_TABLET | Freq: Four times a day (QID) | ORAL | Status: DC | PRN
  Administered 2015-05-04 – 2015-05-05 (×2): 650 mg via ORAL
  Filled 2015-05-03 (×2): qty 2

## 2015-05-03 MED ORDER — ONDANSETRON HCL 4 MG PO TABS: 4.0000 mg | ORAL_TABLET | Freq: Four times a day (QID) | ORAL | Status: DC | PRN

## 2015-05-03 MED ORDER — SODIUM CHLORIDE 0.9 % IV SOLN: INTRAVENOUS | Status: DC

## 2015-05-03 MED ORDER — LORAZEPAM 0.5 MG PO TABS
0.5000 mg | ORAL_TABLET | ORAL | Status: DC | PRN
  Administered 2015-05-03: 1 mg via ORAL
  Administered 2015-05-04 – 2015-05-05 (×2): 0.5 mg via ORAL
  Filled 2015-05-03 (×2): qty 1
  Filled 2015-05-03: qty 2

## 2015-05-03 MED ORDER — ONDANSETRON HCL 4 MG/2ML IJ SOLN: 4.0000 mg | Freq: Four times a day (QID) | INTRAMUSCULAR | Status: DC | PRN

## 2015-05-03 MED ORDER — IPRATROPIUM-ALBUTEROL 0.5-2.5 (3) MG/3ML IN SOLN: 3.0000 mL | Freq: Four times a day (QID) | RESPIRATORY_TRACT | Status: DC | PRN

## 2015-05-03 MED ORDER — MORPHINE SULFATE (CONCENTRATE) 10 MG/0.5ML PO SOLN: 2.5000 mg | ORAL | Status: DC | PRN

## 2015-05-03 MED ORDER — TEMAZEPAM 7.5 MG PO CAPS: 7.5000 mg | ORAL_CAPSULE | Freq: Every evening | ORAL | Status: DC | PRN

## 2015-05-03 MED ORDER — DEXTROSE 50 % IV SOLN
1.0000 | Freq: Once | INTRAVENOUS | Status: AC
  Administered 2015-05-03: 50 mL via INTRAVENOUS
  Filled 2015-05-03: qty 50

## 2015-05-03 MED ORDER — DIPHENHYDRAMINE HCL 50 MG/ML IJ SOLN: 12.5000 mg | INTRAMUSCULAR | Status: DC | PRN

## 2015-05-03 MED ORDER — FUROSEMIDE 10 MG/ML IJ SOLN
20.0000 mg | Freq: Once | INTRAMUSCULAR | Status: AC
  Administered 2015-05-03: 20 mg via INTRAVENOUS
  Filled 2015-05-03: qty 4

## 2015-05-03 MED ORDER — BIOTENE DRY MOUTH MT LIQD: 15.0000 mL | OROMUCOSAL | Status: DC | PRN

## 2015-05-03 MED ORDER — MORPHINE SULFATE 2 MG/ML IJ SOLN
1.0000 mg | INTRAMUSCULAR | Status: DC | PRN
Start: 2015-05-03 — End: 2015-05-04
  Administered 2015-05-03 (×3): 1 mg via INTRAVENOUS
  Filled 2015-05-03 (×3): qty 1

## 2015-05-03 MED ORDER — LORAZEPAM 2 MG/ML IJ SOLN: 0.5000 mg | INTRAMUSCULAR | Status: DC | PRN

## 2015-05-03 MED ORDER — MENTHOL 3 MG MT LOZG
1.0000 | LOZENGE | OROMUCOSAL | Status: DC | PRN
  Filled 2015-05-03: qty 9

## 2015-05-03 MED ORDER — POLYVINYL ALCOHOL 1.4 % OP SOLN
1.0000 [drp] | Freq: Four times a day (QID) | OPHTHALMIC | Status: DC | PRN
  Filled 2015-05-03: qty 15

## 2015-05-03 MED ORDER — POLYETHYLENE GLYCOL 3350 17 G PO PACK
17.0000 g | PACK | Freq: Every day | ORAL | Status: DC
  Administered 2015-05-03 – 2015-05-05 (×3): 17 g via ORAL
  Filled 2015-05-03 (×3): qty 1

## 2015-05-03 MED ORDER — INSULIN ASPART 100 UNIT/ML ~~LOC~~ SOLN
5.0000 [IU] | Freq: Once | SUBCUTANEOUS | Status: AC
  Administered 2015-05-03: 5 [IU] via SUBCUTANEOUS
  Filled 2015-05-03: qty 1

## 2015-05-03 MED ORDER — ALBUTEROL SULFATE (2.5 MG/3ML) 0.083% IN NEBU: 2.5000 mg | INHALATION_SOLUTION | RESPIRATORY_TRACT | Status: DC | PRN

## 2015-05-03 MED ORDER — LORAZEPAM 2 MG/ML IJ SOLN: 1.0000 mg | INTRAMUSCULAR | Status: DC | PRN

## 2015-05-03 MED ORDER — BISACODYL 10 MG RE SUPP: 10.0000 mg | Freq: Every day | RECTAL | Status: DC | PRN

## 2015-05-03 MED ORDER — CALCIUM GLUCONATE 10 % IV SOLN
1.0000 g | Freq: Once | INTRAVENOUS | Status: AC
  Administered 2015-05-03: 1 g via INTRAVENOUS
  Filled 2015-05-03: qty 10

## 2015-05-03 MED ORDER — SODIUM CHLORIDE 0.9 % IJ SOLN
3.0000 mL | Freq: Two times a day (BID) | INTRAMUSCULAR | Status: DC
  Administered 2015-05-03 – 2015-05-05 (×4): 3 mL via INTRAVENOUS

## 2015-05-03 MED ORDER — CARBOXYMETHYLCELLULOSE SODIUM 1 % OP SOLN
1.0000 [drp] | Freq: Three times a day (TID) | OPHTHALMIC | Status: DC | PRN
Start: 2015-05-03 — End: 2015-05-03

## 2015-05-03 MED ORDER — LORAZEPAM 2 MG/ML PO CONC: 1.0000 mg | ORAL | Status: DC | PRN

## 2015-05-03 MED ORDER — ATROPINE SULFATE 1 % OP SOLN
4.0000 [drp] | OPHTHALMIC | Status: DC | PRN
  Filled 2015-05-03: qty 2

## 2015-05-03 MED ORDER — DOCUSATE SODIUM 100 MG PO CAPS: 100.0000 mg | ORAL_CAPSULE | Freq: Every day | ORAL | Status: DC

## 2015-05-03 MED ORDER — SENNA 8.6 MG PO TABS
2.0000 | ORAL_TABLET | Freq: Every day | ORAL | Status: DC
  Administered 2015-05-03 – 2015-05-04 (×2): 17.2 mg via ORAL
  Filled 2015-05-03 (×2): qty 2

## 2015-05-03 MED ORDER — ACETAMINOPHEN 650 MG RE SUPP: 650.0000 mg | Freq: Four times a day (QID) | RECTAL | Status: DC | PRN

## 2015-05-03 NOTE — Clinical Social Work Note (Signed)
CSW received consult from MD for inpatient hospice  CSW met with pt's grand daughter [name] who is POA/HCPOA for pt and discussed hospice services  Pt's grand daughter would like to work with Hospice in [facility] and hopes pt qualifies for in patient care  CSW spoke with [facility] liaison diane and provided her referral for pt  Week day CSW will follow up with Hospice agency to assess whether pt will qualify for inpatient stay  If so Hospice stated that they will meet with family tomorrow morning  .Dede Query, LCSW [facility] Clinical Social Worker - Weekend Coverage cell #: [phone]

## 2015-05-03 NOTE — Clinical Social Work Note (Signed)
Clinical Social Work Assessment  Patient Details  Name: Janet Bond MRN: [id number] Date of Birth: 1923-02-22  Date of referral:  05/03/15               Reason for consult:  End of Life/Hospice                Permission sought to share information with:  Facility Sport and exercise psychologist, Family Supports Permission granted to share information::  Yes, Verbal Permission Granted  Name::        Agency::     Relationship::     Contact Information:     Housing/Transportation Living arrangements for the past 2 months:   (Pt was staying alone in her nephews home until june then began rehab at two different locations. Most recent SNF was [place]) Source of Information:  Power of Forensic psychologist ([name] is POA/HCPOA) Patient Interpreter Needed:  None Criminal Activity/Legal Involvement Pertinent to Current Situation/Hospitalization:    Significant Relationships:   (grand children) Lives with:  Self Do you feel safe going back to the place where you live?  No Need for family participation in patient care:  Yes (Comment)  Care giving concerns:  Pt's grand daughter had been caring for pt in her apartment in between rehab stays.  [place] daughter is concerned with pt having to go back to rehab stating that they did not take good care of pts needs   Social Worker assessment / plan:  CSW met with pt and her [name] at bedside to discuss hospice services.  CSW provided information about hospice services and prompted pt's grand daughter to discuss pt history and current needs.  CSW encouraged pt's grand daughter to explore her thoughts and feelings regarding pt's diagnoses and hospice services.  CSW provided active and supportive listening.  CSW refered to [facility] of possible for in patient care.  Hospice will review and call pt's grand daughter tomorrow morning.  Employment status:  Retired  Forensic scientist:  Managed Care PT Recommendations:  Not assessed at this  time Information / Referral to community resources:     Patient/Family's Response to care:  Pt sleeping.  Pt's grand daughter tearful as she discussed recent months of pt going back and forth from hospital to rehab. Pt's grand daughter does not want pt to go back to rehab and stated that they would not send pt to the hospital when she had pneumonia and instead discharged her home.  Pt's grand daughter moved her furniture out of the living room and moved pt into her apartment for short time and would be willing to figure something out if in patient hospice is not approved in home hospice would be wanted for pt.  Patient/Family's Understanding of and Emotional Response to Diagnosis, Current Treatment, and Prognosis:  Pt's grand daughter stated that she and MD have spoken to pt about her diagnoses and pt has stated that  She 'does not want to suffer', she has 'made peace' and ' knows what's going on'.  Pt's grand daughter tired and tearful as she described the last couple of months with her grandmother in and out of rehab and the hospital and now in kidney failure, cannot do dialysis, has CHF/A Fib, suffered a heart attack and a stroke in June and suffered the loss of her grandson in May.  Although there are other grand children this grand daughter is taking on the responsibility of pt's care.  Emotional Assessment Appearance:  Appears younger than stated  age Attitude/Demeanor/Rapport:   (pleasant) Affect (typically observed):  Accepting, Calm Orientation:  Oriented to Self, Oriented to Place, Oriented to  Time, Oriented to Situation Alcohol / Substance use:    Psych involvement (Current and /or in the community):  No (Comment)  Discharge Needs  Concerns to be addressed:    Readmission within the last 30 days:  Yes Current discharge risk:    Barriers to Discharge:  No Barriers Identified   Carlean Jews, LCSW 05/03/2015, 3:17 PM

## 2015-05-03 NOTE — ED Notes (Signed)
Bed: ZO10 Expected date:  Expected time:  Means of arrival:  Comments: EMS 47F [facility]

## 2015-05-03 NOTE — ED Notes (Signed)
Dr. Adela Lank made aware of i stat values

## 2015-05-03 NOTE — ED Provider Notes (Signed)
CSN: [id number]     Arrival date & time 05/03/15  0728 History   First MD Initiated Contact with Patient 05/03/15 [phone]     Chief Complaint  Patient presents with  . Shortness of Breath     (Consider location/radiation/quality/duration/timing/severity/associated sxs/prior Treatment) Patient is a 81 y.o. female presenting with shortness of breath. The history is provided by the patient.  Shortness of Breath Severity:  Moderate Onset quality:  Sudden Duration:  2 days Timing:  Constant Progression:  Worsening Chronicity:  Chronic Relieved by:  Nothing Worsened by:  Nothing tried Ineffective treatments:  None tried Associated symptoms: no chest pain, no fever, no headaches, no vomiting and no wheezing    81 yo F with a chief complaint shortness breath. This is been an ongoing problem. Patient was recently admitted for thought to be fluid overloaded. Patient denies any weight gain as far she can tell. Patient feels that her breathing got worse over the past couple days. Mild cough but really unchanged. Patient denies any chest pain and diaphoresis. Patient denies abdominal pain. Patient with atrial fibrillation last hospitalization is awaiting to discuss anticoagulation with her PCP. Denies prior history of blood clots.  Past Medical History  Diagnosis Date  . Essential hypertension   . Type II diabetes mellitus   . Questionable History of Atrial Fibrillation     a. questionable hx of this->no afib during 12/2014 hospitalization.  . CVA (cerebral infarction)     a. 01/16/15: embolic event following an acute myocardial infarction and cardiac catheterization  . Mild Aortic Stenosis     a. mild by 2D ECHO 12/2014:  Mean gradient (S): 11 mm Hg. Peak gradient (S): 20 mm Hg. Valve area (VTI): 1.36 cm^2. Valve area (Vmax): 1.17 cm^2. Valve area (Vmean): 1.14 cm^2.  Marland Kitchen CAD (coronary artery disease)     a. 01/09/15: Acute inf STEMI/PCI: LM nl, LAD/LCX mod dzs, RPL 100 (s/p PTCA/aspiration  thrombectomy).  Marland Kitchen HLD (hyperlipidemia)   . Carotid artery disease     a. carotid dopplers: 01/18/15 w/ bIlateral intimal wall thickening CCA. Moderate to severe calcific plaque origin and proximal ICA and ECA with acoustic shadowing. 1-39% ICA stenosis. Right vertebral artery flow is to-fro suggesting proximal stenosis  . H/O echocardiogram     a. 12/2014 Echo: EF 60-65%, Gr 2 DD, mild AS/AI.  Marland Kitchen Stroke    Past Surgical History  Procedure Laterality Date  . Wrist fracture surgery    . Leg surgery    . Left heart catheterization with coronary angiogram N/A 01/12/2015    Procedure: LEFT HEART CATHETERIZATION WITH CORONARY ANGIOGRAM;  Surgeon: Corky Crafts, MD;  Location: [facility] CATH LAB;  Service: Cardiovascular;  Laterality: N/A;   Family History  Problem Relation Age of Onset  . Stroke Father   . Hypertension Father   . Stroke Mother   . Hypertension Mother    History  Substance Use Topics  . Smoking status: Never Smoker   . Smokeless tobacco: Never Used  . Alcohol Use: No   OB History    No data available     Review of Systems  Constitutional: Negative for fever and chills.  HENT: Negative for congestion and rhinorrhea.   Eyes: Negative for redness and visual disturbance.  Respiratory: Positive for shortness of breath. Negative for wheezing.   Cardiovascular: Negative for chest pain and palpitations.  Gastrointestinal: Negative for nausea and vomiting.  Genitourinary: Negative for dysuria and urgency.  Musculoskeletal: Negative for myalgias and arthralgias.  Skin:  Negative for pallor and wound.  Neurological: Positive for weakness. Negative for dizziness and headaches.      Allergies  Codeine; Influenza vaccines; Aspirin; and Chicken allergy  Home Medications   Prior to Admission medications   Medication Sig Start Date End Date Taking? Authorizing Provider  amLODipine (NORVASC) 5 MG tablet Take 5 mg by mouth daily. 01/20/15  Yes Historical Provider, MD   atorvastatin (LIPITOR) 40 MG tablet Take 1 tablet (40 mg total) by mouth daily at 6 PM. 01/19/15  Yes Janetta Hora, PA-C  carvedilol (COREG) 12.5 MG tablet Take 1 tablet (12.5 mg total) by mouth 2 (two) times daily with a meal. 01/19/15  Yes Janetta Hora, PA-C  diltiazem (CARDIZEM) 60 MG tablet Take 1 tablet (60 mg total) by mouth every 6 (six) hours. 04/22/15  Yes Maryann Mikhail, DO  ergocalciferol (VITAMIN D2) 50000 UNITS capsule Take 50,000 Units by mouth once a week. On Monday   Yes Historical Provider, MD  feeding supplement, ENSURE ENLIVE, (ENSURE ENLIVE) LIQD Take 237 mLs by mouth 2 (two) times daily between meals. 04/22/15  Yes Maryann Mikhail, DO  ferrous sulfate 325 (65 FE) MG tablet Take 325 mg by mouth daily with breakfast.   Yes Historical Provider, MD  furosemide (LASIX) 20 MG tablet Take 20 mg by mouth 2 (two) times daily. 04/14/15 04/13/16 Yes Historical Provider, MD  ipratropium-albuterol (DUONEB) 0.5-2.5 (3) MG/3ML SOLN Take 3 mLs by nebulization every 6 (six) hours as needed (shortness of breath).   Yes Historical Provider, MD  megestrol (MEGACE) 400 MG/10ML suspension Take 400 mg by mouth daily.  02/24/15  Yes Historical Provider, MD  menthol-cetylpyridinium (CEPACOL) 3 MG lozenge Take 1 lozenge (3 mg total) by mouth as needed for sore throat. 04/22/15  Yes Maryann Mikhail, DO  metFORMIN (GLUCOPHAGE) 1000 MG tablet Take 1,000 mg by mouth 2 (two) times daily with a meal.  02/07/15  Yes Historical Provider, MD  Multiple Vitamin (MULTIVITAMIN) tablet Take 1 tablet by mouth daily.   Yes Historical Provider, MD  nitroGLYCERIN (NITROSTAT) 0.4 MG SL tablet Place 0.4 mg under the tongue every 5 (five) minutes as needed for chest pain. May repeat for 2 doses as needed for chest pain.   Yes Historical Provider, MD  pantoprazole (PROTONIX) 40 MG tablet Take 1 tablet (40 mg total) by mouth 2 (two) times daily before a meal. 01/19/15  Yes Janetta Hora, PA-C  Polyvinyl Alcohol-Povidone  (REFRESH OP) Apply 1 drop to eye 4 (four) times daily as needed (dry eyes).   Yes Historical Provider, MD  senna-docusate (SENOKOT-S) 8.6-50 MG per tablet Take 1 tablet by mouth 2 (two) times daily. 04/22/15  Yes Maryann Mikhail, DO  traMADol (ULTRAM) 50 MG tablet Take one tablet by mouth three times daily as needed for pain Patient taking differently: Take 50 mg by mouth 3 (three) times daily as needed for moderate pain or severe pain.  04/22/15  Yes Kimber Relic, MD   BP 151/80 mmHg  Pulse 110  Temp(Src) 97.9 F (36.6 C) (Oral)  Resp 18  Ht 5' (1.524 m)  Wt 137 lb 9 oz (62.398 kg)  BMI 26.87 kg/m2  SpO2 96% Physical Exam  Constitutional: She is oriented to person, place, and time. She appears well-developed and well-nourished. No distress.  HENT:  Head: Normocephalic and atraumatic.  JVD to the angle of the jaw  Eyes: EOM are normal. Pupils are equal, round, and reactive to light.  Neck: Normal range of motion. Neck  supple.  Cardiovascular: Normal rate and regular rhythm.  Exam reveals no gallop and no friction rub.   No murmur heard. Pulmonary/Chest: Effort normal. She has no wheezes. She has rales (in bilateral bases).  Abdominal: Soft. She exhibits no distension. There is no tenderness.  Musculoskeletal: She exhibits edema (Left greater than right). She exhibits no tenderness.  Neurological: She is alert and oriented to person, place, and time.  Skin: Skin is warm and dry. She is not diaphoretic.  Psychiatric: She has a normal mood and affect. Her behavior is normal.    ED Course  Procedures (including critical care time) Labs Review Labs Reviewed  CBC WITH DIFFERENTIAL/PLATELET - Abnormal; Notable for the following:    RBC 3.01 (*)    Hemoglobin 8.0 (*)    HCT 26.1 (*)    RDW 17.0 (*)    Neutrophils Relative % 90 (*)    Neutro Abs 7.9 (*)    Lymphocytes Relative 6 (*)    Lymphs Abs 0.5 (*)    All other components within normal limits  COMPREHENSIVE METABOLIC PANEL -  Abnormal; Notable for the following:    Sodium 132 (*)    Potassium 6.5 (*)    Chloride 100 (*)    Glucose, Bld 124 (*)    BUN 100 (*)    Creatinine, Ser 4.01 (*)    Calcium 8.7 (*)    Total Protein 5.7 (*)    Albumin 2.7 (*)    AST 42 (*)    GFR calc non Af Amer 9 (*)    GFR calc Af Amer 10 (*)    All other components within normal limits  BRAIN NATRIURETIC PEPTIDE - Abnormal; Notable for the following:    B Natriuretic Peptide 649.7 (*)    All other components within normal limits  URINALYSIS, ROUTINE W REFLEX MICROSCOPIC (NOT AT [facility]) - Abnormal; Notable for the following:    APPearance CLOUDY (*)    Hgb urine dipstick MODERATE (*)    Protein, ur 30 (*)    Leukocytes, UA LARGE (*)    All other components within normal limits  URINE MICROSCOPIC-ADD ON - Abnormal; Notable for the following:    Squamous Epithelial / LPF MANY (*)    Bacteria, UA MANY (*)    All other components within normal limits  I-STAT CHEM 8, ED - Abnormal; Notable for the following:    Sodium 131 (*)    Potassium 6.4 (*)    Chloride 100 (*)    BUN 94 (*)    Creatinine, Ser 4.20 (*)    Glucose, Bld 119 (*)    Hemoglobin 9.5 (*)    HCT 28.0 (*)    All other components within normal limits  I-STAT TROPOININ, ED    Imaging Review Dg Chest 2 View  05/03/2015   CLINICAL DATA:  Shortness of breath today history of coronary artery disease  EXAM: CHEST  2 VIEW  COMPARISON:  04/20/2015 mild cardiac enlargement stable. Aortic arch calcification again identified.  FINDINGS: There is vascular congestion with patchy interstitial infiltrate throughout the lungs and more focal right upper lobe opacity, likely asymmetric alveolar edema. There are small pleural effusions.  IMPRESSION: Congestive heart failure with mild to moderate pulmonary edema   Electronically Signed   By: Esperanza Heir M.D.   On: 05/03/2015 08:22     EKG Interpretation   Date/Time:  Sunday May 03 2015 07:54:36 EDT Ventricular Rate:  71 PR  Interval:    QRS Duration:  72 QT Interval:  375 QTC Calculation: 407 R Axis:   121 Text Interpretation:  Atrial fibrillation Right axis deviation Low  voltage, extremity and precordial leads Confirmed by ZACKOWSKI  MD, SCOTT  ([id number]) on 05/03/2015 8:05:15 AM      MDM   Diagnosis: 1: acute renal failure 2. Hyperkalemia 3. Uremia 4. Pulmonary edema  81 yo F with a chief complaint of shortness of breath. Patient with JVD up to the angle of the jaw. Rales at the bases. Edema to the bilateral lower extremities. Feel this may be recurrent CHF exacerbation. Currently not requiring more than her home O2.  Chest x-ray with worsening edema compared to prior.  Lab work concerning for acute renal failure with hyperkalemia.  No noted ecg changes.  Repeat with istat, admit to the hospital.  The patients results and plan were reviewed and discussed.   Any x-rays performed were independently reviewed by myself.   Differential diagnosis were considered with the presenting HPI.  Medications  menthol-cetylpyridinium (CEPACOL) lozenge 3 mg (not administered)  ipratropium-albuterol (DUONEB) 0.5-2.5 (3) MG/3ML nebulizer solution 3 mL (not administered)  0.9 %  sodium chloride infusion (not administered)  bisacodyl (DULCOLAX) suppository 10 mg (not administered)  ondansetron (ZOFRAN) tablet 4 mg (not administered)    Or  ondansetron (ZOFRAN) injection 4 mg (not administered)  sodium chloride 0.9 % injection 3 mL (not administered)  albuterol (PROVENTIL) (2.5 MG/3ML) 0.083% nebulizer solution 2.5 mg (not administered)  acetaminophen (TYLENOL) tablet 650 mg (not administered)    Or  acetaminophen (TYLENOL) suppository 650 mg (not administered)  morphine 2 MG/ML injection 1-2 mg (1 mg Intravenous Given 05/03/15 1744)  temazepam (RESTORIL) capsule 7.5 mg (not administered)  diphenhydrAMINE (BENADRYL) injection 12.5 mg (not administered)  atropine 1 % ophthalmic solution 4 drop (not administered)   antiseptic oral rinse (BIOTENE) solution 15 mL (not administered)  polyvinyl alcohol (LIQUIFILM TEARS) 1.4 % ophthalmic solution 1 drop (not administered)  polyethylene glycol (MIRALAX / GLYCOLAX) packet 17 g (not administered)  morphine CONCENTRATE 10 MG/0.5ML oral solution 2.6-5 mg (not administered)  senna (SENOKOT) tablet 17.2 mg (not administered)  LORazepam (ATIVAN) injection 0.5-1 mg (not administered)  LORazepam (ATIVAN) tablet 0.5-1 mg (not administered)  furosemide (LASIX) injection 20 mg (20 mg Intravenous Given 05/03/15 0919)  calcium gluconate 1 g in sodium chloride 0.9 % 100 mL IVPB (0 g Intravenous Stopped 05/03/15 1143)  insulin aspart (novoLOG) injection 5 Units (5 Units Subcutaneous Given 05/03/15 1137)  dextrose 50 % solution 50 mL (50 mLs Intravenous Given 05/03/15 1137)    Filed Vitals:   05/03/15 1014 05/03/15 1100 05/03/15 1200 05/03/15 1245  BP: 139/55 139/55 131/62 151/80  Pulse: 89 98 100 110  Temp:    97.9 F (36.6 C)  TempSrc:    Oral  Resp: 20 22 20 18   Height:    5' (1.524 m)  Weight:    137 lb 9 oz (62.398 kg)  SpO2: 97% 95% 96% 96%    Final diagnoses:  "Walking corpse" syndrome    Admission/ observation were discussed with the admitting physician, patient and/or family and they are comfortable with the plan.    Melene Plan, DO 05/03/15 [phone]

## 2015-05-03 NOTE — ED Notes (Signed)
Spoke with Pt's granddaughter over the phone at pt's request. Granddaughter reports pt has been coming to the hospital every weekend for evaluation. She is on her way and will provide more information at that time.   Dorita Sciara [phone]

## 2015-05-03 NOTE — Consult Note (Signed)
Consultation Note Date: 05/03/2015   Patient Name: Janet Bond  DOB: 13-Feb-1923  MRN: [id number]  Age / Sex: 81 y.o., female   PCP: No primary care provider on file. Referring Physician: Starleen Arms, MD  Reason for Consultation: Non pain symptom management and Pain control  Palliative Care Assessment and Plan Summary of Established Goals of Care and Medical Treatment Preferences   Clinical Assessment/Narrative: Pt is a 81 yo female admitted with acute dyspnea, palpitations. She was in a-fib with RVR. Pt was diuresed  Pt was recently dc'd from SNF and was being treated for PNA and UTI. Upon arriving home she was persistently SHOB.  Pt is alert orieneted to self and situation. Granddaughter is her Management consultant. After discussing with her family they have elected full comfort care with hopeful transfer [facility]. Asked to review comfort meds in place by Dr. Randol Kern. Granddaughter states there is a family meeting tomorrow at 1100am with [facility] liason and would like for a member of our team to be present to help clarify with family memebers her disease process  Contacts/Participants in Discussion: Primary Decision Maker: Pt and granddaughter   HCPOA: yes  Granddaughter  Janet Bond [phone]  Code Status/Advance Care Planning:  DNR  Comfort Care: No transfusion or further work up for GIB  No dialysis for acute renal failure  No further bipap  Symptom Management:   Pain : Pt denies pain. MS04 prn on board for dyspnea which would help moderate to sever pain as well as tylenol prn  Dyspnea: Continue supplemental 02 as tolerated, prn neb treatment. Order ms04 concentrate 2.5mg -5mg  q2 prn as well as iv ms04 1-2mg  q2 prn. Pt does not want further BiPAP  N/V: No c/o current nausea. PRN nausea available  Secretions: No current congestion. PRN atropine available  Chronic constipation: Will add miralax and start senna 2 tabs daily   Additional  Recommendations (Limitations, Scope, Preferences): none Psycho-social/Spiritual:   Support System: yes  Desire for further Chaplaincy support:no  Prognosis: < 2 weeks. Pt now with multi-factorial respiratory failure: acute anemia on 7/23/ with hgb drop to 6.7. She was transfused and is now 8. ? GIB; pulmonary edema, aifib with RVR and acute renal failure; diastolic heart failure. Creatinine 4 BUN 100 and potassium 6.5 on adm. She does not desire further BiPAP, transfusion, tx for GIB or renal failure. No dialysis  Discharge Planning:  Hospice facility       Chief Complaint/History of Present Illness: Pt is a 81 yo female with a h/o a-fib with RVR, HTN, PNA, CVA admitted with worsening dyspnea. She was found to be in fluid overload. She has been diuresed and verbalizes breathing improved Primary Diagnoses  Present on Admission:  . Acute renal failure . Atrial fibrillation . Essential hypertension . HLD (hyperlipidemia) . CAD (coronary artery disease) . Protein-calorie malnutrition, severe  Palliative Review of Systems: Denies pain, CP, n/v. She is verbalizing overall feeling better but is still somewhat short of breath I have reviewed the medical record, interviewed the patient and family, and examined the patient. The following aspects are pertinent.  Past Medical History  Diagnosis Date  . Essential hypertension   . Type II diabetes mellitus   . Questionable History of Atrial Fibrillation     a. questionable hx of this->no afib during 12/2014 hospitalization.  . CVA (cerebral infarction)     a. 01/16/15: embolic event following an acute myocardial infarction and cardiac catheterization  . Mild Aortic Stenosis  a. mild by 2D ECHO 12/2014:  Mean gradient (S): 11 mm Hg. Peak gradient (S): 20 mm Hg. Valve area (VTI): 1.36 cm^2. Valve area (Vmax): 1.17 cm^2. Valve area (Vmean): 1.14 cm^2.  Marland Kitchen CAD (coronary artery disease)     a. 01/09/15: Acute inf STEMI/PCI: LM nl, LAD/LCX mod  dzs, RPL 100 (s/p PTCA/aspiration thrombectomy).  Marland Kitchen HLD (hyperlipidemia)   . Carotid artery disease     a. carotid dopplers: 01/18/15 w/ bIlateral intimal wall thickening CCA. Moderate to severe calcific plaque origin and proximal ICA and ECA with acoustic shadowing. 1-39% ICA stenosis. Right vertebral artery flow is to-fro suggesting proximal stenosis  . H/O echocardiogram     a. 12/2014 Echo: EF 60-65%, Gr 2 DD, mild AS/AI.  Marland Kitchen Stroke    History   Social History  . Marital Status: Single    Spouse Name: N/A  . Number of Children: N/A  . Years of Education: N/A   Social History Main Topics  . Smoking status: Never Smoker   . Smokeless tobacco: Never Used  . Alcohol Use: No  . Drug Use: No  . Sexual Activity: Not on file   Other Topics Concern  . None   Social History Narrative   Family History  Problem Relation Age of Onset  . Stroke Father   . Hypertension Father   . Stroke Mother   . Hypertension Mother    Scheduled Meds: . polyethylene glycol  17 g Oral Daily  . senna  2 tablet Oral Daily  . sodium chloride  3 mL Intravenous Q12H   Continuous Infusions: . sodium chloride     PRN Meds:.acetaminophen **OR** acetaminophen, albuterol, antiseptic oral rinse, atropine, bisacodyl, diphenhydrAMINE, ipratropium-albuterol, LORazepam, LORazepam, menthol-cetylpyridinium, morphine injection, morphine CONCENTRATE, ondansetron **OR** ondansetron (ZOFRAN) IV, polyvinyl alcohol, temazepam Medications Prior to Admission:  Prior to Admission medications   Medication Sig Start Date End Date Taking? Authorizing Provider  amLODipine (NORVASC) 5 MG tablet Take 5 mg by mouth daily. 01/20/15  Yes Historical Provider, MD  atorvastatin (LIPITOR) 40 MG tablet Take 1 tablet (40 mg total) by mouth daily at 6 PM. 01/19/15  Yes Janetta Hora, PA-C  carvedilol (COREG) 12.5 MG tablet Take 1 tablet (12.5 mg total) by mouth 2 (two) times daily with a meal. 01/19/15  Yes Janetta Hora, PA-C    diltiazem (CARDIZEM) 60 MG tablet Take 1 tablet (60 mg total) by mouth every 6 (six) hours. 04/22/15  Yes Maryann Mikhail, DO  ergocalciferol (VITAMIN D2) 50000 UNITS capsule Take 50,000 Units by mouth once a week. On Monday   Yes Historical Provider, MD  feeding supplement, ENSURE ENLIVE, (ENSURE ENLIVE) LIQD Take 237 mLs by mouth 2 (two) times daily between meals. 04/22/15  Yes Maryann Mikhail, DO  ferrous sulfate 325 (65 FE) MG tablet Take 325 mg by mouth daily with breakfast.   Yes Historical Provider, MD  furosemide (LASIX) 20 MG tablet Take 20 mg by mouth 2 (two) times daily. 04/14/15 04/13/16 Yes Historical Provider, MD  ipratropium-albuterol (DUONEB) 0.5-2.5 (3) MG/3ML SOLN Take 3 mLs by nebulization every 6 (six) hours as needed (shortness of breath).   Yes Historical Provider, MD  megestrol (MEGACE) 400 MG/10ML suspension Take 400 mg by mouth daily.  02/24/15  Yes Historical Provider, MD  menthol-cetylpyridinium (CEPACOL) 3 MG lozenge Take 1 lozenge (3 mg total) by mouth as needed for sore throat. 04/22/15  Yes Maryann Mikhail, DO  metFORMIN (GLUCOPHAGE) 1000 MG tablet Take 1,000 mg by mouth 2 (two)  times daily with a meal.  02/07/15  Yes Historical Provider, MD  Multiple Vitamin (MULTIVITAMIN) tablet Take 1 tablet by mouth daily.   Yes Historical Provider, MD  nitroGLYCERIN (NITROSTAT) 0.4 MG SL tablet Place 0.4 mg under the tongue every 5 (five) minutes as needed for chest pain. May repeat for 2 doses as needed for chest pain.   Yes Historical Provider, MD  pantoprazole (PROTONIX) 40 MG tablet Take 1 tablet (40 mg total) by mouth 2 (two) times daily before a meal. 01/19/15  Yes Janetta Hora, PA-C  Polyvinyl Alcohol-Povidone (REFRESH OP) Apply 1 drop to eye 4 (four) times daily as needed (dry eyes).   Yes Historical Provider, MD  senna-docusate (SENOKOT-S) 8.6-50 MG per tablet Take 1 tablet by mouth 2 (two) times daily. 04/22/15  Yes Maryann Mikhail, DO  traMADol (ULTRAM) 50 MG tablet Take  one tablet by mouth three times daily as needed for pain Patient taking differently: Take 50 mg by mouth 3 (three) times daily as needed for moderate pain or severe pain.  04/22/15  Yes Kimber Relic, MD   Allergies  Allergen Reactions  . Codeine Nausea And Vomiting  . Influenza Vaccines Nausea And Vomiting  . Aspirin Nausea And Vomiting  . Chicken Allergy Nausea And Vomiting   CBC:    Component Value Date/Time   WBC 8.8 05/03/2015 0902   HGB 9.5* 05/03/2015 1120   HCT 28.0* 05/03/2015 1120   PLT 260 05/03/2015 0902   MCV 86.7 05/03/2015 0902   NEUTROABS 7.9* 05/03/2015 0902   LYMPHSABS 0.5* 05/03/2015 0902   MONOABS 0.3 05/03/2015 0902   EOSABS 0.0 05/03/2015 0902   BASOSABS 0.0 05/03/2015 0902   Comprehensive Metabolic Panel:    Component Value Date/Time   NA 131* 05/03/2015 1120   K 6.4* 05/03/2015 1120   CL 100* 05/03/2015 1120   CO2 25 05/03/2015 0902   BUN 94* 05/03/2015 1120   CREATININE 4.20* 05/03/2015 1120   GLUCOSE 119* 05/03/2015 1120   CALCIUM 8.7* 05/03/2015 0902   AST 42* 05/03/2015 0902   ALT 20 05/03/2015 0902   ALKPHOS 52 05/03/2015 0902   BILITOT 0.5 05/03/2015 0902   PROT 5.7* 05/03/2015 0902   ALBUMIN 2.7* 05/03/2015 0902    Physical Exam: Vital Signs: BP 151/80 mmHg  Pulse 110  Temp(Src) 97.9 F (36.6 C) (Oral)  Resp 18  Ht 5' (1.524 m)  Wt 62.398 kg (137 lb 9 oz)  BMI 26.87 kg/m2  SpO2 96% SpO2: SpO2: 96 % O2 Device: O2 Device: Nasal Cannula O2 Flow Rate: O2 Flow Rate (L/min): 3 L/min Intake/output summary:  Intake/Output Summary (Last 24 hours) at 05/03/15 1749 Last data filed at 05/03/15 1300  Gross per 24 hour  Intake    240 ml  Output      0 ml  Net    240 ml   LBM:   Baseline Weight: Weight: 62.398 kg (137 lb 9 oz) Most recent weight: Weight: 62.398 kg (137 lb 9 oz)  Exam Findings:  General: Frail elderly female Resp: Mild work of breathing noted.  Cardiac: Kristen Cardinal         Palliative Performance Scale: 40%               Additional Data Reviewed: Recent Labs     05/03/15  0902  05/03/15  1120  WBC  8.8   --   HGB  8.0*  9.5*  PLT  260   --   NA  132*  131*  BUN  100*  94*  CREATININE  4.01*  4.20*     Time In: 1645 Time Out: 1800 Time Total: 80 min Greater than 50%  of this time was spent counseling and coordinating care related to the above assessment and plan. Discussed with Dr. Randol Kern. Will plan for a member of Palliative Medicine team to attend 1100 am family meeting with [facility] liaison 05/04/15  Signed by: Irean Hong, NP  Irean Hong, NP  05/03/2015, 5:49 PM  Please contact Palliative Medicine Team phone at [phone] for questions and concerns.

## 2015-05-03 NOTE — H&P (Signed)
Patient Demographics  Janet Bond, is a 81 y.o. female  MRN: [id number]   DOB - 1922-10-04  Admit Date - 05/03/2015  Outpatient Primary MD for the patient is No primary care provider on file.   With History of -  Past Medical History  Diagnosis Date  . Essential hypertension   . Type II diabetes mellitus   . Questionable History of Atrial Fibrillation     a. questionable hx of this->no afib during 12/2014 hospitalization.  . CVA (cerebral infarction)     a. 01/16/15: embolic event following an acute myocardial infarction and cardiac catheterization  . Mild Aortic Stenosis     a. mild by 2D ECHO 12/2014:  Mean gradient (S): 11 mm Hg. Peak gradient (S): 20 mm Hg. Valve area (VTI): 1.36 cm^2. Valve area (Vmax): 1.17 cm^2. Valve area (Vmean): 1.14 cm^2.  Marland Kitchen CAD (coronary artery disease)     a. 01/09/15: Acute inf STEMI/PCI: LM nl, LAD/LCX mod dzs, RPL 100 (s/p PTCA/aspiration thrombectomy).  Marland Kitchen HLD (hyperlipidemia)   . Carotid artery disease     a. carotid dopplers: 01/18/15 w/ bIlateral intimal wall thickening CCA. Moderate to severe calcific plaque origin and proximal ICA and ECA with acoustic shadowing. 1-39% ICA stenosis. Right vertebral artery flow is to-fro suggesting proximal stenosis  . H/O echocardiogram     a. 12/2014 Echo: EF 60-65%, Gr 2 DD, mild AS/AI.  Marland Kitchen Stroke       Past Surgical History  Procedure Laterality Date  . Wrist fracture surgery    . Leg surgery    . Left heart catheterization with coronary angiogram N/A 01/12/2015    Procedure: LEFT HEART CATHETERIZATION WITH CORONARY ANGIOGRAM;  Surgeon: Corky Crafts, MD;  Location: [facility] C[facility] LAB;  Service: Cardiovascular;  Laterality: N/A;    in for   Chief Complaint  Patient presents with  . Shortness of Breath     HPI  Janet Bond  is a 81 y.o. female, with h/o DM, HFpEF, Afib  , CAD, pulmonary hypertension and chronic hypoxic respiratory failure requiring 2-3L at home, with recent multiple hospitalization,  most recently discharged on 7/28 with pneumonia, Patient presents today with complaints of progressive shortness of breath, worsening lower extremity edema, workup significant for pulmonary edema on chest x-ray, as well multiple labs abnormalities and is significant for acute renal failure with a creatinine of 4, BUN of 100, potassium 6.5, a slight anemia with hemoglobin of 8, she was given 20 of IV Lasix, patient with known history of chronic diastolic CHF, discussed with family goals of care, both patient and her granddaughter (next of kin), they expressed their wishes for no active or aggressive management, given her recurrent hospitalization, and poor quality of life recently, discussed options, including hospice and comfort care, and granddaughter she works as Lawyer in a hospice care, and they want to proceed with hospice/comfort care measure at this point.    Review of Systems    In addition to the HPI above,  No Fever-chills, No Headache, No changes with Vision or hearing, No problems swallowing food or Liquids, No Chest pain, and planes of progressive Shortness of Breath, No Abdominal pain, No Nausea or Vommitting, Bowel movements are regular, No Blood in stool or Urine, Reports dysuria, No new skin rashes or bruises, Reports generalized weakness No recent weight gain or loss, No polyuria, polydypsia or polyphagia, No significant Mental Stressors.  A full 10 point Review of Systems was done, except as stated above, all  other Review of Systems were negative.   Social History History  Substance Use Topics  . Smoking status: Never Smoker   . Smokeless tobacco: Never Used  . Alcohol Use: No     Family History Family History  Problem Relation Age of Onset  . Stroke Father   . Hypertension Father   . Stroke Mother   . Hypertension Mother      Prior to Admission medications   Medication Sig Start Date End Date Taking? Authorizing Provider  amLODipine (NORVASC) 5 MG tablet  Take 5 mg by mouth daily. 01/20/15  Yes Historical Provider, MD  atorvastatin (LIPITOR) 40 MG tablet Take 1 tablet (40 mg total) by mouth daily at 6 PM. 01/19/15  Yes Janetta Hora, PA-C  carvedilol (COREG) 12.5 MG tablet Take 1 tablet (12.5 mg total) by mouth 2 (two) times daily with a meal. 01/19/15  Yes Janetta Hora, PA-C  diltiazem (CARDIZEM) 60 MG tablet Take 1 tablet (60 mg total) by mouth every 6 (six) hours. 04/22/15  Yes Maryann Mikhail, DO  ergocalciferol (VITAMIN D2) 50000 UNITS capsule Take 50,000 Units by mouth once a week. On Monday   Yes Historical Provider, MD  feeding supplement, ENSURE ENLIVE, (ENSURE ENLIVE) LIQD Take 237 mLs by mouth 2 (two) times daily between meals. 04/22/15  Yes Maryann Mikhail, DO  ferrous sulfate 325 (65 FE) MG tablet Take 325 mg by mouth daily with breakfast.   Yes Historical Provider, MD  furosemide (LASIX) 20 MG tablet Take 20 mg by mouth 2 (two) times daily. 04/14/15 04/13/16 Yes Historical Provider, MD  ipratropium-albuterol (DUONEB) 0.5-2.5 (3) MG/3ML SOLN Take 3 mLs by nebulization every 6 (six) hours as needed (shortness of breath).   Yes Historical Provider, MD  megestrol (MEGACE) 400 MG/10ML suspension Take 400 mg by mouth daily.  02/24/15  Yes Historical Provider, MD  menthol-cetylpyridinium (CEPACOL) 3 MG lozenge Take 1 lozenge (3 mg total) by mouth as needed for sore throat. 04/22/15  Yes Maryann Mikhail, DO  metFORMIN (GLUCOPHAGE) 1000 MG tablet Take 1,000 mg by mouth 2 (two) times daily with a meal.  02/07/15  Yes Historical Provider, MD  Multiple Vitamin (MULTIVITAMIN) tablet Take 1 tablet by mouth daily.   Yes Historical Provider, MD  nitroGLYCERIN (NITROSTAT) 0.4 MG SL tablet Place 0.4 mg under the tongue every 5 (five) minutes as needed for chest pain. May repeat for 2 doses as needed for chest pain.   Yes Historical Provider, MD  pantoprazole (PROTONIX) 40 MG tablet Take 1 tablet (40 mg total) by mouth 2 (two) times daily before a meal.  01/19/15  Yes Janetta Hora, PA-C  Polyvinyl Alcohol-Povidone (REFRESH OP) Apply 1 drop to eye 4 (four) times daily as needed (dry eyes).   Yes Historical Provider, MD  senna-docusate (SENOKOT-S) 8.6-50 MG per tablet Take 1 tablet by mouth 2 (two) times daily. 04/22/15  Yes Maryann Mikhail, DO  traMADol (ULTRAM) 50 MG tablet Take one tablet by mouth three times daily as needed for pain Patient taking differently: Take 50 mg by mouth 3 (three) times daily as needed for moderate pain or severe pain.  04/22/15  Yes Kimber Relic, MD    Allergies  Allergen Reactions  . Codeine Nausea And Vomiting  . Influenza Vaccines Nausea And Vomiting  . Aspirin Nausea And Vomiting  . Chicken Allergy Nausea And Vomiting    Physical Exam  Vitals  Blood pressure 139/55, pulse 98, temperature 97.7 F (36.5 C), temperature source Oral, resp.  rate 22, SpO2 95 %.   1. General  frail elderly-appearing female in bed mild respiratory distress  2. Normal affect and insight, Not Suicidal or Homicidal, Awake Alert, Oriented X 3.  3. No F.N deficits, ALL C.Nerves Intact.  4. Ears and Eyes appear Normal, Conjunctivae clear, PERRLA. dry  Oral Mucosa.  5. Supple Neck, ++ JVD, No cervical lymphadenopathy appriciated, No Carotid Bruits.  6. Symmetrical Chest wall movement,Use of accessory muscle,bibasilar crackles   7. no Gallops, Rubs or Murmurs, No Parasternal Heave.  8. Positive Bowel Sounds, Abdomen Soft, No tenderness, No organomegaly appriciated,No rebound -guarding or rigidity.  9.  No Cyanosis, Normal Skin Turgor, No Skin Rash or Bruise.  10. Good muscle tone,  joints appear normal , no effusions,   11. No Palpable Lymph Nodes in Neck or Axillae    Data Review  CBC  Recent Labs Lab 05/03/15 0902 05/03/15 1120  WBC 8.8  --   HGB 8.0* 9.5*  HCT 26.1* 28.0*  PLT 260  --   MCV 86.7  --   MCH 26.6  --   MCHC 30.7  --   RDW 17.0*  --   LYMPHSABS 0.5*  --   MONOABS 0.3  --   EOSABS  0.0  --   BASOSABS 0.0  --    ------------------------------------------------------------------------------------------------------------------  Chemistries   Recent Labs Lab 05/03/15 0902 05/03/15 1120  NA 132* 131*  K 6.5* 6.4*  CL 100* 100*  CO2 25  --   GLUCOSE 124* 119*  BUN 100* 94*  CREATININE 4.01* 4.20*  CALCIUM 8.7*  --   AST 42*  --   ALT 20  --   ALKPHOS 52  --   BILITOT 0.5  --    ------------------------------------------------------------------------------------------------------------------ estimated creatinine clearance is 7.4 mL/min (by C-G formula based on Cr of 4.2). ------------------------------------------------------------------------------------------------------------------ No results for input(s): TSH, T4TOTAL, T3FREE, THYROIDAB in the last 72 hours.  Invalid input(s): FREET3   Coagulation profile No results for input(s): INR, PROTIME in the last 168 hours. ------------------------------------------------------------------------------------------------------------------- No results for input(s): DDIMER in the last 72 hours. -------------------------------------------------------------------------------------------------------------------  Cardiac Enzymes No results for input(s): CKMB, TROPONINI, MYOGLOBIN in the last 168 hours.  Invalid input(s): CK ------------------------------------------------------------------------------------------------------------------ Invalid input(s): POCBNP   ---------------------------------------------------------------------------------------------------------------  Urinalysis    Component Value Date/Time   COLORURINE YELLOW 05/03/2015 1000   APPEARANCEUR CLOUDY* 05/03/2015 1000   LABSPEC 1.015 05/03/2015 1000   PHURINE 5.5 05/03/2015 1000   GLUCOSEU NEGATIVE 05/03/2015 1000   HGBUR MODERATE* 05/03/2015 1000   BILIRUBINUR NEGATIVE 05/03/2015 1000   KETONESUR NEGATIVE 05/03/2015 1000   PROTEINUR  30* 05/03/2015 1000   UROBILINOGEN 0.2 05/03/2015 1000   NITRITE NEGATIVE 05/03/2015 1000   LEUKOCYTESUR LARGE* 05/03/2015 1000    ----------------------------------------------------------------------------------------------------------------  Imaging results:   Dg Chest 2 View  05/03/2015   CLINICAL DATA:  Shortness of breath today history of coronary artery disease  EXAM: CHEST  2 VIEW  COMPARISON:  04/20/2015 mild cardiac enlargement stable. Aortic arch calcification again identified.  FINDINGS: There is vascular congestion with patchy interstitial infiltrate throughout the lungs and more focal right upper lobe opacity, likely asymmetric alveolar edema. There are small pleural effusions.  IMPRESSION: Congestive heart failure with mild to moderate pulmonary edema   Electronically Signed   By: Esperanza Heir M.D.   On: 05/03/2015 08:22       Assessment & Plan  Active Problems:   Atrial fibrillation   Essential hypertension   HLD (hyperlipidemia)   CAD (coronary  artery disease)   Diabetes mellitus   Protein-calorie malnutrition, severe   Acute renal failure    Acute on chronic respiratory failure  - This is secondary to pulmonary edema and volume overload , discussed with length of the family regarding available options , at this point they wish for no further active treatment , their wishes are clear of out to proceed with hospice/comfort care , so all medications were stopped, patient started on when necessary morphine, Ativan, for her shortness of breath , given an extra dose of Lasix to help with her pulmonary edema .  Acute renal failure   coronary artery disease diabetes mellitus Severe protein calorie malnutrition - please  see above discussion.     Family Communication: Admission, patients condition and plan of care including tests being ordered have been discussed with the patient and her granddaughter(next of kin), patient has no children, outlived her 14  siblings, and her only son, patient is coherent, clear about she is ready to meet God .  Code Status DNR/comfort  Likely DC to  social worker consulted   Condition GUARDED    Time spent in minutes : 55 minutes    ELGERGAWY, DAWOOD M.D on 05/03/2015 at 12:09 PM  Between 7am to 7pm - Pager - [phone]  After 7pm go to [email or web address] - password [email or web address]  And look for the night coverage person covering me after hours  [facility] Office  [phone]

## 2015-05-03 NOTE — ED Notes (Signed)
Per EMS, pt was at [facility] facility for PNA for 2-3d. Pt c/o increased ShoB. Hx of afib and EKG showed stable afib. Pt arrived on 4L [place].

## 2015-05-04 MED ORDER — SENNOSIDES-DOCUSATE SODIUM 8.6-50 MG PO TABS
1.0000 | ORAL_TABLET | Freq: Every day | ORAL | Status: DC
  Administered 2015-05-04: 1 via ORAL
  Filled 2015-05-04 (×2): qty 1

## 2015-05-04 MED ORDER — OXYCODONE HCL 20 MG/ML PO CONC
2.5000 mg | ORAL | Status: DC | PRN
  Administered 2015-05-05: 2.6 mg via ORAL
  Filled 2015-05-04: qty 1

## 2015-05-04 NOTE — Progress Notes (Signed)
CSW continuing to follow.   CSW received notification from [facility] RN liaison, Orbie Pyo that she met with pt granddaughter and assessed pt. Per [facility] RN liaison, Orbie Pyo, pt does not qualify for residential hospice placement, but does qualify for hospice services in the home and when pt is discharged, pt will go to the home of her POA/granddaughter, Gavriela Cashin with [facility] [place] [facility].  CSW contacted covering RNCM, Dessa Phi to notify.   [facility] RN liaison, Orbie Pyo arranged home equipment needs.   CSW to continue to follow to assist with ambulance transport for pt to pt granddaughter home if needed upon discharge.  Alison Murray, MSW, [place] [employer] [phone]

## 2015-05-04 NOTE — Progress Notes (Signed)
Daily Progress Note   Patient Name: Janet Bond       Date: 05/04/2015 DOB: 1922/11/11  Age: 81 y.o. MRN#: [id number] Attending Physician: Albertine Patricia, MD Primary Care Physician: No primary care provider on file. Admit Date: 05/03/2015  Reason for Consultation/Follow-up: Establishing goals of care  Subjective:     I met with Ms. Zeitler and her family as they met with hospice to arrange disposition. They discussed home with hospice and a plan to potentially transition to hospice facility. Offered therapeutic listening and support of decisions. Assisted with explanations of her decline and poor renal function now likely at end of life. Family and their minister very understanding but family also struggling with the idea of being without their grandmother and the Runell Gess of their family who was recently driving and very functional - even worked up until a few years ago. They discuss different ways to make her limited time special for her and for them. Emotional support provided.    Length of Stay: 1 day  Current Medications: Scheduled Meds:  . polyethylene glycol  17 g Oral Daily  . senna  2 tablet Oral Daily  . sodium chloride  3 mL Intravenous Q12H    Continuous Infusions: . sodium chloride      PRN Meds: acetaminophen **OR** acetaminophen, albuterol, antiseptic oral rinse, atropine, bisacodyl, diphenhydrAMINE, ipratropium-albuterol, LORazepam, LORazepam, menthol-cetylpyridinium, ondansetron **OR** ondansetron (ZOFRAN) IV, oxyCODONE, polyvinyl alcohol, temazepam  Palliative Performance Scale: 30%     Vital Signs: BP 132/70 mmHg  Pulse 115  Temp(Src) 98 F (36.7 C) (Axillary)  Resp 18  Ht 5' (1.524 m)  Wt 62.398 kg (137 lb 9 oz)  BMI 26.87 kg/m2  SpO2 100% SpO2: SpO2: 100 % O2 Device: O2 Device: Nasal Cannula O2 Flow Rate: O2 Flow Rate (L/min): 3 L/min  Intake/output summary:  Intake/Output Summary (Last 24 hours) at 05/04/15 1257 Last data filed at 05/04/15  0800  Gross per 24 hour  Intake    480 ml  Output    500 ml  Net    -20 ml   LBM: Last BM Date: 05/02/15 Baseline Weight: Weight: 62.398 kg (137 lb 9 oz) Most recent weight: Weight: 62.398 kg (137 lb 9 oz)  Physical Exam: General: NAD, elderly, frail, thin HEENT: Temporal muscle wasting CVS: Irreg Resp: No labored breathing   Additional Data Reviewed: Recent Labs     05/03/15  0902  05/03/15  1120  WBC  8.8   --   HGB  8.0*  9.5*  PLT  260   --   NA  132*  131*  BUN  100*  94*  CREATININE  4.01*  4.20*     Problem List:  Patient Active Problem List   Diagnosis Date Noted  . Acute renal failure 05/03/2015  . Palliative care encounter   . Protein-calorie malnutrition, severe 04/22/2015  . Pressure ulcer 04/21/2015  . Chronic a-fib 04/18/2015  . STEMI (ST elevation myocardial infarction) 03/17/2015  . Atrial flutter 03/17/2015  . Encounter for family conference with patient present 02/07/2015  . Stroke   . Carotid artery disease   . HLD (hyperlipidemia)   . CAD (coronary artery disease)   . Aortic stenosis   . CVA (cerebral infarction)   . Diabetes mellitus   . Hypertension   . History of atrial fibrillation   . Acute myocardial infarction of inferior wall   . Essential hypertension   . Atrial fibrillation      Palliative Care  Assessment & Plan    Code Status:  DNR  Goals of Care:  Comfort care  Desire for further Chaplaincy support:no  3. Symptom Management:  Pain/SOB: Oxycodone intensol 2.6 mg every 2 hours prn.   Anxiety: Lorazepam 0.5-1 mg every 4 hours prn.   4. Palliative Prophylaxis:  Stool Softner: Senokot-S daily.   5. Prognosis: < 4 weeks very likely  5. Discharge Planning: Home with Hospice   Thank you for allowing the Palliative Medicine Team to assist in the care of this patient.   Time In: 1100 Time Out: 1210 Total Time 60mn Prolonged Time Billed  yes     Greater than 50%  of this time was spent counseling and  coordinating care related to the above assessment and plan.   AVinie Sill NP Palliative Medicine Team Pager # [phone](M-F 8a-5p) Team Phone # [phone](Nights/Weekends)  05/04/2015, 12:57 PM

## 2015-05-04 NOTE — Progress Notes (Signed)
Patient Demographics  Janet Bond, is a 81 y.o. female, DOB - 13-Oct-1922, ZOX:[id number]  Admit date - 05/03/2015   Admitting Physician Starleen Arms, MD  Outpatient Primary MD for the patient is No primary care provider on file.  LOS - 1   Chief Complaint  Patient presents with  . Shortness of Breath       Admission HPI/Brief narrative: 81 y.o. female, with h/o DM, HFpEF, Afib , CAD, pulmonary hypertension and chronic hypoxic respiratory failure requiring 2-3L at home, with recent multiple hospitalization, since with acute on chronic respiratory failure, acute renal failure, given patient's poor life quality, multiple recent admissions, patient and family choose comfort care.  Subjective:   Janet Bond today has, No headache, No chest pain, reports she is comfortable, has no complaints.  Assessment & Plan    Active Problems:   Atrial fibrillation   Essential hypertension   HLD (hyperlipidemia)   CAD (coronary artery disease)   Diabetes mellitus   Protein-calorie malnutrition, severe   Acute renal failure   Palliative care encounter  Acute on chronic respiratory failure Acute renal failure  coronary artery disease diabetes mellitus Severe protein calorie malnutrition - At this point patient is comfort care, she is on when necessary morphine, when necessary Ativan, appears comfortable, has been seen by palliative care for assisting, social work/case management were consulted regarding best disposition plan for the patient.  Code Status: Comfort  Family Communication: None at bedside  Disposition Plan: Hospice at home versus facility   Procedures  none    Consults   PALLIATIVE   Medications  Scheduled Meds: . polyethylene glycol  17 g Oral Daily  . senna-docusate  1 tablet Oral QHS  . sodium chloride  3 mL Intravenous Q12H   Continuous Infusions: . sodium chloride      PRN Meds:.acetaminophen **OR** acetaminophen, albuterol, antiseptic oral rinse, atropine, bisacodyl, diphenhydrAMINE, ipratropium-albuterol, LORazepam, LORazepam, menthol-cetylpyridinium, ondansetron **OR** ondansetron (ZOFRAN) IV, oxyCODONE, polyvinyl alcohol, temazepam  DVT Prophylaxis  none  Lab Results  Component Value Date   PLT 260 05/03/2015    Antibiotics    Anti-infectives    None          Objective:   Filed Vitals:   05/03/15 1200 05/03/15 1245 05/03/15 2116 05/04/15 0615  BP: 131/62 151/80 123/63 132/70  Pulse: 100 110 92 115  Temp:  97.9 F (36.6 C) 98.3 F (36.8 C) 98 F (36.7 C)  TempSrc:  Oral Oral Axillary  Resp: Height:  5' (1.524 m)    Weight:  62.398 kg (137 lb 9 oz)    SpO2: 96% 96% 100% 100%    Wt Readings from Last 3 Encounters:  05/03/15 62.398 kg (137 lb 9 oz)  04/22/15 62.4 kg (137 lb 9.1 oz)  01/29/15 57.788 kg (127 lb 6.4 oz)     Intake/Output Summary (Last 24 hours) at 05/04/15 1644 Last data filed at 05/04/15 1500  Gross per 24 hour  Intake    240 ml  Output    800 ml  Net   -560 ml     Physical Exam  Awake Alert, [place].AT,PERRAL Supple Neck, dry oral mucosa  Symmetrical Chest wall movement, shallow breathing RRR,No Gallops, +ve B.Sounds,  Abd Soft, No tenderness,  +2 edema, clammy extremities   Data Review   Micro Results No results found for this or any previous visit (from the past 240 hour(s)).  Radiology Reports Dg Chest 2 View  05/03/2015   CLINICAL DATA:  Shortness of breath today history of coronary artery disease  EXAM: CHEST  2 VIEW  COMPARISON:  04/20/2015 mild cardiac enlargement stable. Aortic arch calcification again identified.  FINDINGS: There is vascular congestion with patchy interstitial infiltrate throughout the lungs and more focal right upper lobe opacity, likely asymmetric alveolar edema. There are small pleural effusions.  IMPRESSION: Congestive heart failure with mild to moderate  pulmonary edema   Electronically Signed   By: Esperanza Heir M.D.   On: 05/03/2015 08:22   Dg Chest 2 View  04/18/2015   CLINICAL DATA:  81 year old female with shortness of breath  EXAM: CHEST  2 VIEW  COMPARISON:  Radiograph dated 03/10/2015  FINDINGS: There has been interval improvement of the of pulmonary opacities. A focal residual opacity is noted in the right mid lung field. Right hilar lymph node/granuloma. Small bilateral pleural effusions. Stable cardiomegaly. Degenerative changes of the spine. No acute fracture.  IMPRESSION: Interval significant improvement in previously seen diffuse airspace opacities. Small residual focal opacity in the right mid lung field.  Small bilateral pleural effusions.   Electronically Signed   By: Elgie Collard M.D.   On: 04/18/2015 01:48   Dg Chest Port 1 View  04/20/2015   CLINICAL DATA:  Chronic atrial fibrillation, coronary artery disease, aortic stenosis, diabetes.  EXAM: PORTABLE CHEST - 1 VIEW  COMPARISON:  PA and lateral chest x-ray of July 23rd 2016  FINDINGS: The lungs are adequately inflated. The interstitial markings remain increased. There is confluent alveolar opacity in the right mid lung. Slightly increased density in the retrocardiac region laterally is present today. Small bilateral pleural effusions persist. The cardiac silhouette remains enlarged. The pulmonary vascularity remains engorged. The bony thorax is unremarkable with exception of gentle dextrocurvature of the thoracic spine that may be positional.  IMPRESSION: Allowing for the differences in positioning there has not been dramatic interval change since the study of July 23rd. There is persistent mild pulmonary interstitial edema, small bilateral pleural effusions, and an area of confluent airspace opacity in the right mid lung which could reflect pneumonia but neoplasm is not excluded.  Chest CT scanning would be useful to further evaluate the right lung.   Electronically Signed   By:  David  Swaziland M.D.   On: 04/20/2015 07:28     CBC  Recent Labs Lab 05/03/15 0902 05/03/15 1120  WBC 8.8  --   HGB 8.0* 9.5*  HCT 26.1* 28.0*  PLT 260  --   MCV 86.7  --   MCH 26.6  --   MCHC 30.7  --   RDW 17.0*  --   LYMPHSABS 0.5*  --   MONOABS 0.3  --   EOSABS 0.0  --   BASOSABS 0.0  --     Chemistries   Recent Labs Lab 05/03/15 0902 05/03/15 1120  NA 132* 131*  K 6.5* 6.4*  CL 100* 100*  CO2 25  --   GLUCOSE 124* 119*  BUN 100* 94*  CREATININE 4.01* 4.20*  CALCIUM 8.7*  --   AST 42*  --   ALT 20  --   ALKPHOS 52  --   BILITOT 0.5  --    ------------------------------------------------------------------------------------------------------------------ estimated creatinine clearance is 7.4 mL/min (by  C-G formula based on Cr of 4.2). ------------------------------------------------------------------------------------------------------------------ No results for input(s): HGBA1C in the last 72 hours. ------------------------------------------------------------------------------------------------------------------ No results for input(s): CHOL, HDL, LDLCALC, TRIG, CHOLHDL, LDLDIRECT in the last 72 hours. ------------------------------------------------------------------------------------------------------------------ No results for input(s): TSH, T4TOTAL, T3FREE, THYROIDAB in the last 72 hours.  Invalid input(s): FREET3 ------------------------------------------------------------------------------------------------------------------ No results for input(s): VITAMINB12, FOLATE, FERRITIN, TIBC, IRON, RETICCTPCT in the last 72 hours.  Coagulation profile No results for input(s): INR, PROTIME in the last 168 hours.  No results for input(s): DDIMER in the last 72 hours.  Cardiac Enzymes No results for input(s): CKMB, TROPONINI, MYOGLOBIN in the last 168 hours.  Invalid input(s):  CK ------------------------------------------------------------------------------------------------------------------ Invalid input(s): POCBNP     Time Spent in minutes   20 minutes   Reinhard Schack M.D on 05/04/2015 at 4:44 PM  Between 7am to 7pm - Pager - [phone]  After 7pm go to [email or web address] - password [facility]  [facility]   Office  [phone]

## 2015-05-04 NOTE — Care Management Note (Signed)
Case Management Note  Patient Details  Name: Janet Bond MRN: [id number] Date of Birth: 04-Mar-1923  Subjective/Objective:   81 y/o f admitted w/ARF. DNR.From home.   Received call from CSW-to speak to [facility]-D[facility] liason-spoke to grand dtr-Marsha in agreement to home w/[facility], they will manage dme, coordinate delivery of dme w/grand dtr.              Action/Plan:d/c plan home w/[facility].   Expected Discharge Date:                  Expected Discharge Plan:  Home w Hospice Care  In-House Referral:     Discharge planning Services  CM Consult  Post Acute Care Choice:    Choice offered to:  [facility] POA / Guardian  DME Arranged:    DME Agency:     HH Arranged:  RN HH Agency:     Status of Service:  In process, will continue to follow  Medicare Important Message Given:    Date Medicare IM Given:    Medicare IM give by:    Date Additional Medicare IM Given:    Additional Medicare Important Message give by:     If discussed at Long Length of Stay Meetings, dates discussed:    Additional Comments:  Lanier Clam, RN 05/04/2015, 1:14 PM

## 2015-05-04 NOTE — Consult Note (Signed)
[facility]  Met with granddaughter/POA Dorothyann Gibbs, and several other family members to discuss referral. Reviewed hospice philosophy and scope of services that are consistent with stated Goals of Care.  The patient has been approved for services in the home by Dr. Brock Ra. When she is discharged, she will go to the home of her POA/granddaughter, Isamar Nazir. Discharge address: [address] [place], [phone] DME ordered for delivery today from [facility] via portal: Fully electric bed with gel overlay and 1/2 rails, OBT, O2, Nebulizer, 3 in 1, WC.  Please advise when discharge date is known.  Yetta Glassman, [facility] Liaison c. [phone]

## 2015-05-04 NOTE — Consult Note (Signed)
WOC wound consult note Reason for Consult: Assessment and suggestions for care of two open areas near coccyx Wound type: Moisture associated skin damage with a partial thickness open area and a full thickness area in the intragluteal cleft secondary to moisture plus friction (indwelling urinary catheter is new this admission) Pressure Ulcer POA: No Measurement:intragluteal cleft lesion measures 1.5cm x 0.4cm x 0.2cm, left buttock lesion measures 1.5cm x 0.5cm x 0.2cm Wound NWG:[id number] lesion has a 50% pink and a 50% yellow (inflammation) base; left buttock is clean, pink, moist Drainage (amount, consistency, odor) scant serous from both Periwound: slightly macerated Dressing procedure/placement/frequency: I will today implement a therapeutic mattress with low air loss feature and bilateral pressure redistribution heel boots for prevention of wounds in these areas. Now that moisture from urine has been addressed, we will cover the affected area on the buttocks and sacrum/coccyx with a soft silicone foam dressing per protocol. Thank you for this consultation.  The WOC nursing team will not follow, but will remain available to this patient, the nursing and medical teams.  Please re-consult if needed. Thanks, Ladona Mow, MSN, RN, GNP, [place], CWON-AP [phone])

## 2015-05-05 MED ORDER — BISACODYL 10 MG RE SUPP: 10.0000 mg | Freq: Every day | RECTAL | Status: AC | PRN

## 2015-05-05 MED ORDER — HYDROMORPHONE HCL 1 MG/ML PO LIQD: 0.5000 mg | ORAL | Status: AC | PRN

## 2015-05-05 MED ORDER — ACETAMINOPHEN 325 MG PO TABS: 650.0000 mg | ORAL_TABLET | Freq: Four times a day (QID) | ORAL | Status: AC | PRN

## 2015-05-05 MED ORDER — TEMAZEPAM 7.5 MG PO CAPS: 7.5000 mg | ORAL_CAPSULE | Freq: Every evening | ORAL | Status: AC | PRN

## 2015-05-05 MED ORDER — MORPHINE SULFATE 20 MG/5ML PO SOLN: 2.5000 mg | ORAL | Status: DC | PRN

## 2015-05-05 MED ORDER — LORAZEPAM 0.5 MG PO TABS: 0.5000 mg | ORAL_TABLET | ORAL | Status: AC | PRN

## 2015-05-05 NOTE — Discharge Instructions (Signed)
Patient will be discharged on home with hospice - Eating for comfort - When necessary Ativan and morphine for dyspnea and symptom management

## 2015-05-05 NOTE — Care Management Important Message (Signed)
Important Message  Patient Details  Name: Janet Bond MRN: [id number] Date of Birth: November 01, 1922   Medicare Important Message Given:  Yes-second notification given    Haskell Flirt 05/05/2015, 11:58 AMImportant Message  Patient Details  Name: Janet Bond MRN: [id number] Date of Birth: [birth year]   Medicare Important Message Given:  Yes-second notification given    Haskell Flirt 05/05/2015, 11:58 AM

## 2015-05-05 NOTE — Discharge Summary (Addendum)
Janet Bond, is a 81 y.o. female  DOB 08-May-1923  MRN [id number].  Admission date:  05/03/2015  Admitting Physician  Starleen Arms, MD  Discharge Date:  05/05/2015   Primary MD  No primary care provider on file.  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 Essential hypertension   . Type II diabetes mellitus   . Questionable History of Atrial Fibrillation     a. questionable hx of this->no afib during 12/2014 hospitalization.  . CVA (cerebral infarction)     a. 01/16/15: embolic event following an acute myocardial infarction and cardiac catheterization  . Mild Aortic Stenosis     a. mild by 2D ECHO 12/2014:  Mean gradient (S): 11 mm Hg. Peak gradient (S): 20 mm Hg. Valve area (VTI): 1.36 cm^2. Valve area (Vmax): 1.17 cm^2. Valve area (Vmean): 1.14 cm^2.  Marland Kitchen CAD (coronary artery disease)     a. 01/09/15: Acute inf STEMI/PCI: LM nl, LAD/LCX mod dzs, RPL 100 (s/p PTCA/aspiration thrombectomy).  Marland Kitchen HLD (hyperlipidemia)   . Carotid artery disease     a. carotid dopplers: 01/18/15 w/ bIlateral intimal wall thickening CCA. Moderate to severe calcific plaque origin and proximal ICA and ECA with acoustic shadowing. 1-39% ICA stenosis. Right vertebral artery flow is to-fro suggesting proximal stenosis  . H/O echocardiogram     a. 12/2014 Echo: EF 60-65%, Gr 2 DD, mild AS/AI.  Marland Kitchen Stroke     Past Surgical History  Procedure Laterality Date  . Wrist  fracture surgery    . Leg surgery    . Left heart catheterization with coronary angiogram N/A 01/12/2015    Procedure: LEFT HEART CATHETERIZATION WITH CORONARY ANGIOGRAM;  Surgeon: Corky Crafts, MD;  Location: [facility] CATH LAB;  Service: Cardiovascular;  Laterality: N/A;       History of present illness and  Hospital Course:     Kindly see H&P for history of present illness and admission details, please review complete Labs, Consult reports and Test reports for all details in brief  HPI  from the history and physical done on the day of admission  Janet Bond is a 81 y.o. female, with h/o DM, HFpEF, Afib , CAD, pulmonary hypertension and chronic hypoxic respiratory failure requiring 2-3L at home, with recent multiple hospitalization, most recently discharged on 7/28 with pneumonia, Patient presents today with complaints of progressive shortness of breath, worsening lower extremity edema, workup significant for pulmonary edema on chest x-ray, as well multiple labs abnormalities and is significant for acute renal failure with a  creatinine of 4, BUN of 100, potassium 6.5, a slight anemia with hemoglobin of 8, she was given 20 of IV Lasix, patient with known history of chronic diastolic CHF, discussed with family goals of care, both patient and her granddaughter (next of kin), they expressed their wishes for no active or aggressive management, given her recurrent hospitalization, and poor quality of life recently, discussed options, including hospice and comfort care, and granddaughter she works as Lawyer in a hospice care, and they want to proceed with hospice/comfort care measure at this point.  Hospital Course  Acute on chronic respiratory failure Acute renal failure  coronary artery disease diabetes mellitus Severe protein calorie malnutrition - At this point patient is comfort care, has been seen by palliative care, plan is to discharge home with hospice, will be discharged on when necessary  dilaudid , when necessary Ativan, to continue with Lasix or oral to help with her shortness of breath, further management per hospice at home.   Discharge Condition: Comfort care,   Follow UP      Discharge Instructions  and  Discharge Medications         Discharge Instructions    Discharge instructions    Complete by:  As directed   Patient will be discharged on home with hospice - Eating for comfort - When necessary Ativan and morphine for dyspnea and symptom management            Medication List    STOP taking these medications        amLODipine 5 MG tablet  Commonly known as:  NORVASC     atorvastatin 40 MG tablet  Commonly known as:  LIPITOR     carvedilol 12.5 MG tablet  Commonly known as:  COREG     diltiazem 60 MG tablet  Commonly known as:  CARDIZEM     ergocalciferol 50000 UNITS capsule  Commonly known as:  VITAMIN D2     feeding supplement (ENSURE ENLIVE) Liqd     ferrous sulfate 325 (65 FE) MG tablet     ipratropium-albuterol 0.5-2.5 (3) MG/3ML Soln  Commonly known as:  DUONEB     megestrol 400 MG/10ML suspension  Commonly known as:  MEGACE     menthol-cetylpyridinium 3 MG lozenge  Commonly known as:  CEPACOL     metFORMIN 1000 MG tablet  Commonly known as:  GLUCOPHAGE     multivitamin tablet     nitroGLYCERIN 0.4 MG SL tablet  Commonly known as:  NITROSTAT     pantoprazole 40 MG tablet  Commonly known as:  PROTONIX     REFRESH OP     senna-docusate 8.6-50 MG per tablet  Commonly known as:  Senokot-S     traMADol 50 MG tablet  Commonly known as:  ULTRAM      TAKE these medications        acetaminophen 325 MG tablet  Commonly known as:  TYLENOL  Take 2 tablets (650 mg total) by mouth every 6 (six) hours as needed for mild pain (or Fever >/= 101).     bisacodyl 10 MG suppository  Commonly known as:  DULCOLAX  Place 1 suppository (10 mg total) rectally daily as needed for moderate constipation.     furosemide 20 MG  tablet  Commonly known as:  LASIX  Take 20 mg by mouth 2 (two) times daily.     HYDROmorphone HCl 1 MG/ML Liqd  Commonly known as:  DILAUDID  Take 0.5 mLs (0.5 mg total)  by mouth every 3 (three) hours as needed for moderate pain or severe pain (dyspnea).     LORazepam 0.5 MG tablet  Commonly known as:  ATIVAN  Take 1-2 tablets (0.5-1 mg total) by mouth every 4 (four) hours as needed for anxiety.     temazepam 7.5 MG capsule  Commonly known as:  RESTORIL  Take 1 capsule (7.5 mg total) by mouth at bedtime as needed for sleep.          Diet and Activity recommendation: See Discharge Instructions above   Consults obtained - Palliative   Major procedures and Radiology Reports - PLEASE review detailed and final reports for all details, in brief -      Dg Chest 2 View  05/03/2015   CLINICAL DATA:  Shortness of breath today history of coronary artery disease  EXAM: CHEST  2 VIEW  COMPARISON:  04/20/2015 mild cardiac enlargement stable. Aortic arch calcification again identified.  FINDINGS: There is vascular congestion with patchy interstitial infiltrate throughout the lungs and more focal right upper lobe opacity, likely asymmetric alveolar edema. There are small pleural effusions.  IMPRESSION: Congestive heart failure with mild to moderate pulmonary edema   Electronically Signed   By: Esperanza Heir M.D.   On: 05/03/2015 08:22   Dg Chest 2 View  04/18/2015   CLINICAL DATA:  81 year old female with shortness of breath  EXAM: CHEST  2 VIEW  COMPARISON:  Radiograph dated 03/10/2015  FINDINGS: There has been interval improvement of the of pulmonary opacities. A focal residual opacity is noted in the right mid lung field. Right hilar lymph node/granuloma. Small bilateral pleural effusions. Stable cardiomegaly. Degenerative changes of the spine. No acute fracture.  IMPRESSION: Interval significant improvement in previously seen diffuse airspace opacities. Small residual focal opacity in the  right mid lung field.  Small bilateral pleural effusions.   Electronically Signed   By: Elgie Collard M.D.   On: 04/18/2015 01:48   Dg Chest Port 1 View  04/20/2015   CLINICAL DATA:  Chronic atrial fibrillation, coronary artery disease, aortic stenosis, diabetes.  EXAM: PORTABLE CHEST - 1 VIEW  COMPARISON:  PA and lateral chest x-ray of July 23rd 2016  FINDINGS: The lungs are adequately inflated. The interstitial markings remain increased. There is confluent alveolar opacity in the right mid lung. Slightly increased density in the retrocardiac region laterally is present today. Small bilateral pleural effusions persist. The cardiac silhouette remains enlarged. The pulmonary vascularity remains engorged. The bony thorax is unremarkable with exception of gentle dextrocurvature of the thoracic spine that may be positional.  IMPRESSION: Allowing for the differences in positioning there has not been dramatic interval change since the study of July 23rd. There is persistent mild pulmonary interstitial edema, small bilateral pleural effusions, and an area of confluent airspace opacity in the right mid lung which could reflect pneumonia but neoplasm is not excluded.  Chest CT scanning would be useful to further evaluate the right lung.   Electronically Signed   By: David  Swaziland M.D.   On: 04/20/2015 07:28    Micro Results    No results found for this or any previous visit (from the past 240 hour(s)).     Today   Subjective:   Janet Bond today has no headache,no chest abdominal pain, reports shortness of breath at baseline , reports she slept okay overnight . Objective:   Blood pressure 122/68, pulse 111, temperature 97.9 F (36.6 C), temperature source Oral, resp. rate 20, height 5' (1.524  m), weight 62.398 kg (137 lb 9 oz), SpO2 100 %.   Intake/Output Summary (Last 24 hours) at 05/05/15 1520 Last data filed at 05/05/15 0838  Gross per 24 hour  Intake    170 ml  Output    325 ml  Net    -155 ml    Exam Awake Alert, [place].AT,PERRAL Supple Neck, dry oral mucosa  Symmetrical Chest wall movement, shallow breathing RRR,No Gallops, +ve B.Sounds, Abd Soft, No tenderness,  +2 edema, clammy extremities  Data Review   CBC w Diff:  Lab Results  Component Value Date   WBC 8.8 05/03/2015   HGB 9.5* 05/03/2015   HCT 28.0* 05/03/2015   PLT 260 05/03/2015   LYMPHOPCT 6* 05/03/2015   MONOPCT 4 05/03/2015   EOSPCT 0 05/03/2015   BASOPCT 0 05/03/2015    CMP:  Lab Results  Component Value Date   NA 131* 05/03/2015   K 6.4* 05/03/2015   CL 100* 05/03/2015   CO2 25 05/03/2015   BUN 94* 05/03/2015   CREATININE 4.20* 05/03/2015   PROT 5.7* 05/03/2015   ALBUMIN 2.7* 05/03/2015   BILITOT 0.5 05/03/2015   ALKPHOS 52 05/03/2015   AST 42* 05/03/2015   ALT 20 05/03/2015  .   Total Time in preparing paper work, data evaluation and todays exam - 30 minutes  Eliyas Suddreth M.D on 05/05/2015 at 3:20 PM  [facility]   Office  [phone]

## 2015-05-05 NOTE — Progress Notes (Signed)
This CM spoke with granddaughter Mindi Junker who is expecting equipment delivery to the house between 2-5pm. Mindi Junker to call staff RN on floor to inform her when equipment delivered so ambulance can be called to take pt home. No further CM needs noted. Sandford Craze RN,BSN,NCM [phone]

## 2015-05-05 NOTE — Progress Notes (Signed)
Pt for discharge to home with hospice and CSW received notification that pt will need ambulance transport upon discharge.  CSW received notification that pt equipment being delivered between 2-5 pm and pt granddaughter will notify pt RN when equipment delivered in order for ambulance transport to be arranged.   CSW notified RN and RN agreeable to call ambulance transport once confirmed by pt granddaughter that equipment has arrived to pt granddaughter home.   RN to call ambulance transport. If before 5 pm call [phone] to request ambulance transport. If after 5 pm, call [phone] option 1 for English and then option 3 to arrange non-emergency ambulance transport.   Discharge address: [address] [place], [address].  Paperwork needed for ambulance transport provided at nursing station. RN made aware.   No further social work needs identified at this time.  CSW signing off.   Loletta Specter, MSW, LCSW Clinical Social Work [phone]

## 2015-05-12 ENCOUNTER — Ambulatory Visit: Payer: Medicare Other | Admitting: Interventional Cardiology

## 2015-05-13 ENCOUNTER — Encounter: Payer: Self-pay | Admitting: Interventional Cardiology

## 2015-06-27 DEATH — deceased

## 2017-04-05 IMAGING — DX DG CHEST 2V
2 series · 2 of 2 positions shown · non-contrast
Comparison: Radiograph dated 03/10/2015

CLINICAL DATA: 89-year-old female with shortness of breath

EXAM:
CHEST  2 VIEW

[chest lat]
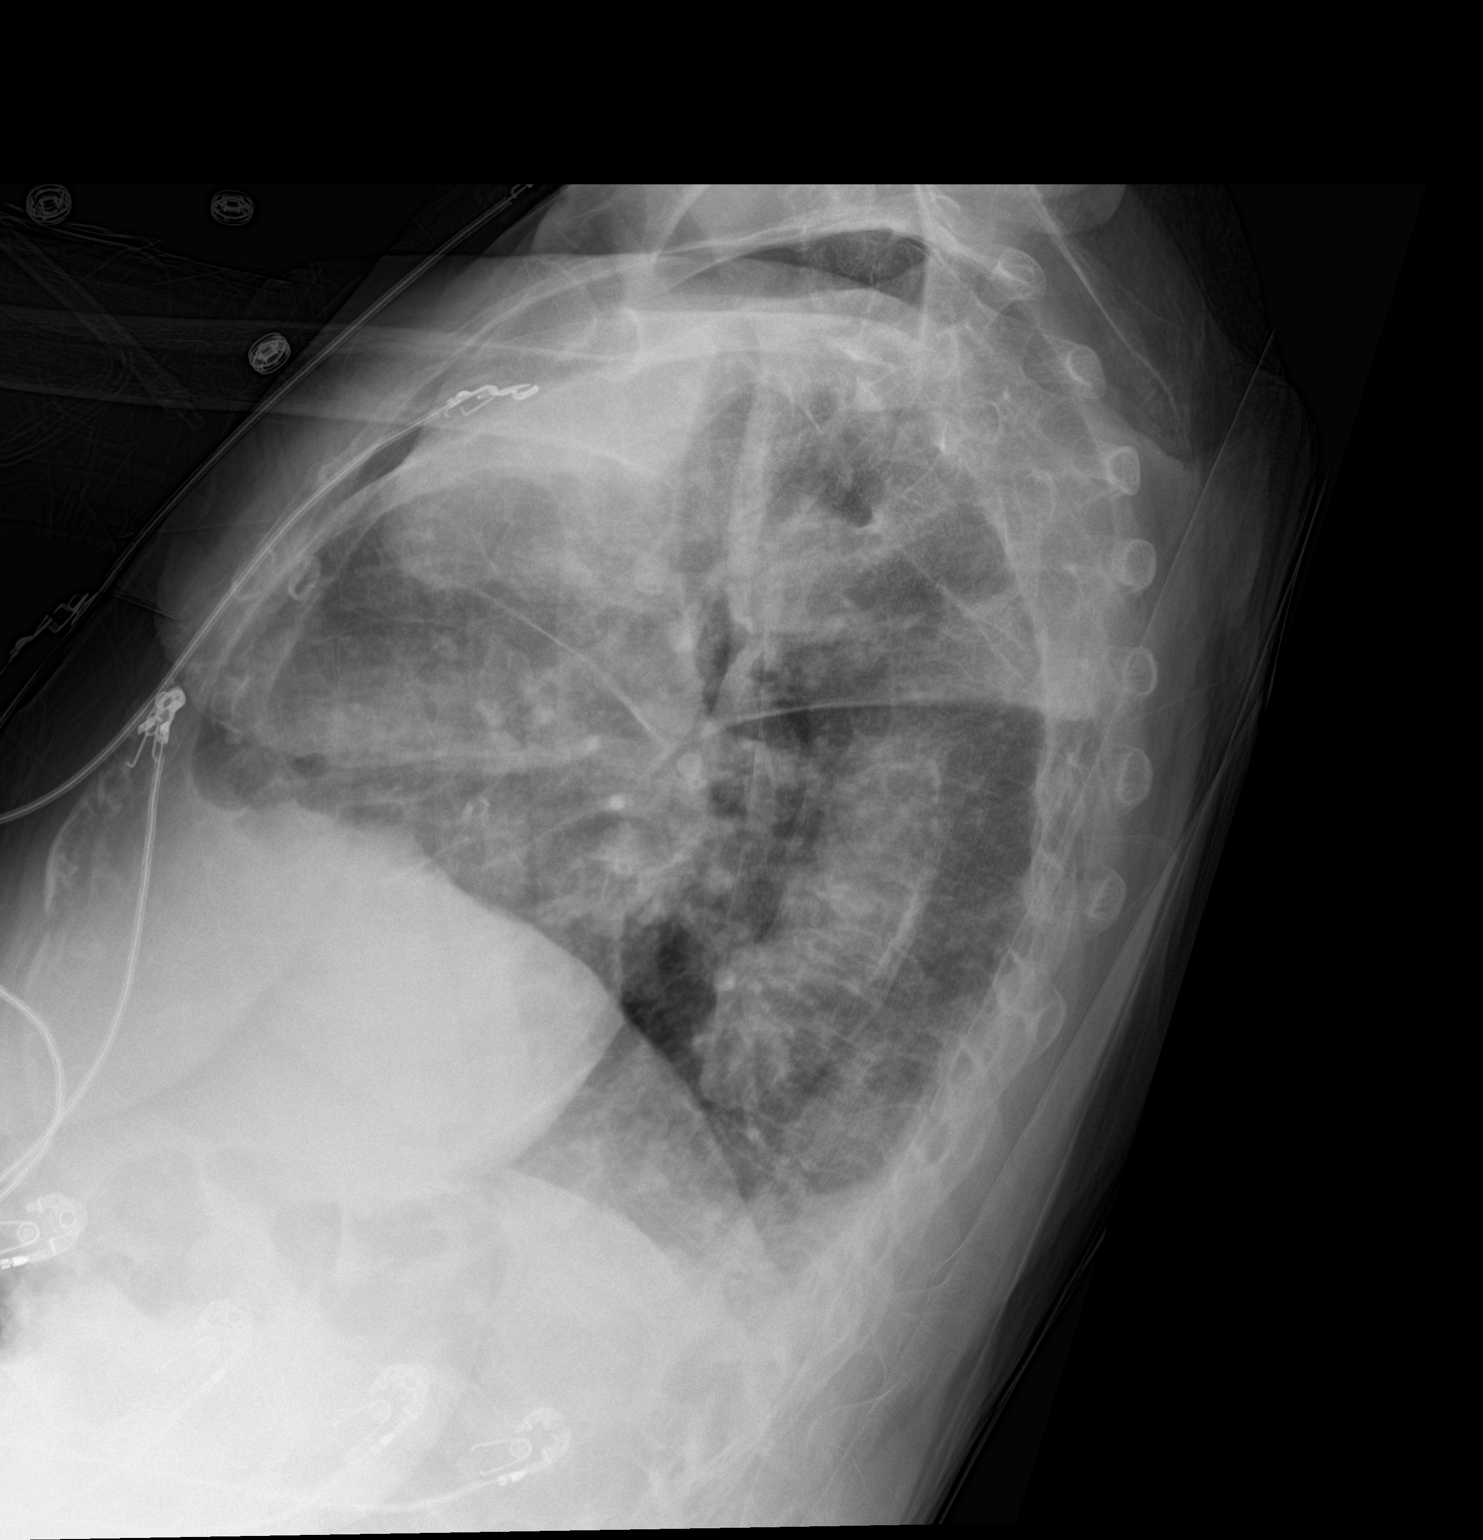

[chest ap]
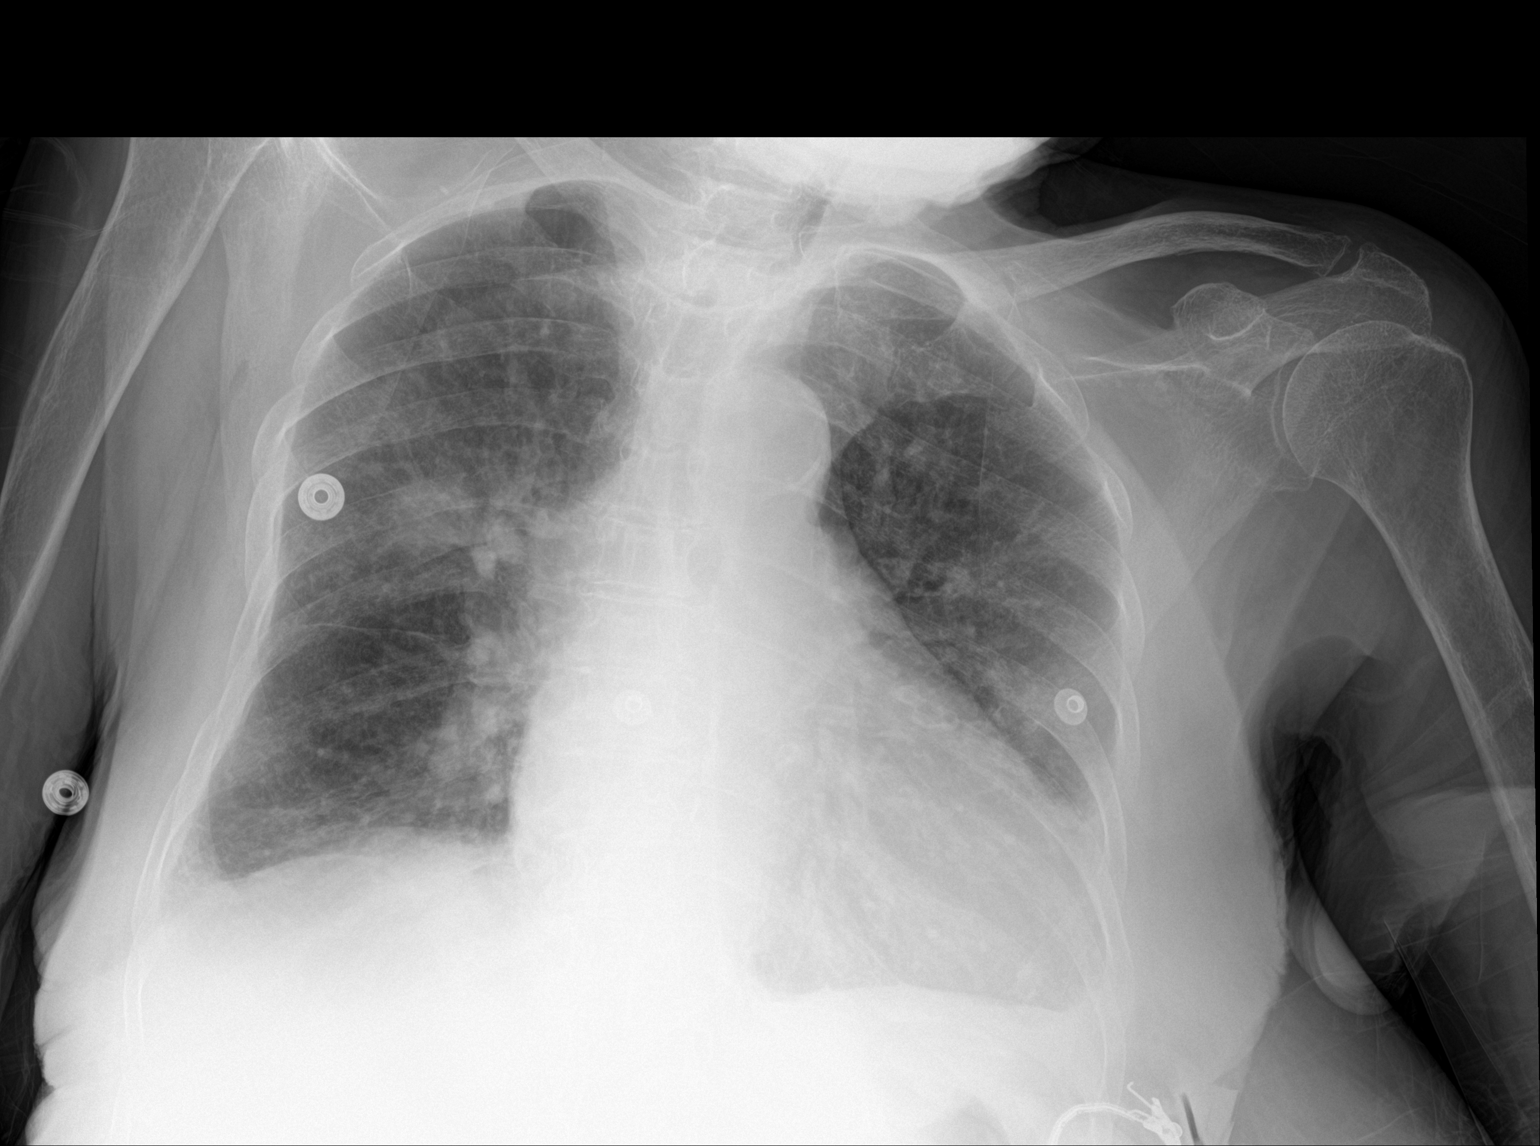

[2 of 2 positions shown; findings below may reference images not displayed]

FINDINGS: There has been interval improvement of the of pulmonary opacities. A
focal residual opacity is noted in the right mid lung field. Right
hilar lymph node/granuloma. Small bilateral pleural effusions.
Stable cardiomegaly. Degenerative changes of the spine. No acute
fracture.
IMPRESSION: Interval significant improvement in previously seen diffuse airspace
opacities. Small residual focal opacity in the right mid lung field.

Small bilateral pleural effusions.
